# Patient Record
Sex: Female | Born: 1990 | Race: White | Hispanic: No | Marital: Married | Smoking: Never smoker
Health system: Southern US, Community
[De-identification: ages and names within clinical notes are randomized; demographics above are authoritative.]

## PROBLEM LIST (undated history)

## (undated) DIAGNOSIS — Z148 Genetic carrier of other disease: Secondary | ICD-10-CM

## (undated) DIAGNOSIS — Z789 Other specified health status: Secondary | ICD-10-CM

## (undated) HISTORY — PX: GALLBLADDER SURGERY: SHX652

## (undated) HISTORY — PX: TONSILLECTOMY: SUR1361

## (undated) HISTORY — PX: WISDOM TOOTH EXTRACTION: SHX21

## (undated) HISTORY — DX: Genetic carrier of other disease: Z14.8

---

## 2016-01-31 DIAGNOSIS — O0281 Inappropriate change in quantitative human chorionic gonadotropin (hCG) in early pregnancy: Secondary | ICD-10-CM

## 2016-06-01 ENCOUNTER — Ambulatory Visit (INDEPENDENT_AMBULATORY_CARE_PROVIDER_SITE_OTHER): Payer: BLUE CROSS/BLUE SHIELD | Admitting: Obstetrics and Gynecology

## 2016-06-01 ENCOUNTER — Encounter: Payer: Self-pay | Admitting: Obstetrics and Gynecology

## 2016-06-01 VITALS — BP 103/63 | HR 64 | Ht 68.0 in | Wt 151.2 lb

## 2016-06-01 DIAGNOSIS — N912 Amenorrhea, unspecified: Secondary | ICD-10-CM | POA: Diagnosis not present

## 2016-06-01 DIAGNOSIS — Z148 Genetic carrier of other disease: Secondary | ICD-10-CM

## 2016-06-01 LAB — POCT URINALYSIS DIPSTICK
Bilirubin, UA: NEGATIVE
Glucose, UA: NEGATIVE
Ketones, UA: NEGATIVE
Leukocytes, UA: NEGATIVE
NITRITE UA: NEGATIVE
PH UA: 5 (ref 5.0–8.0)
Protein, UA: NEGATIVE
RBC UA: NEGATIVE
SPEC GRAV UA: 1.01 (ref 1.010–1.025)
UROBILINOGEN UA: 0.2 U/dL

## 2016-06-01 NOTE — Progress Notes (Signed)
HPI:      [name] is a 26 y.o. [phone] who LMP was Patient's last menstrual period was 04/20/2016 (exact date).  Subjective:   She presents today With complaint of amenorrhea likely consistent with pregnancy. Based on her last menstrual period. She is 6 weeks estimated gestational age. 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 One of her female children has fragile X. Her previous 2 deliveries were vaginal. She has no complaints today.    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 She  does not have a problem list on file. She  has a past surgical history that includes Gallbladder surgery and Tonsillectomy. Her family history includes Asthma in her sister; Hypertension in her father and mother; Thyroid disease in her sister. She  reports that she has never smoked. She has never used smokeless tobacco. She reports that she does not drink alcohol or use drugs. No current outpatient prescriptions on file prior to visit.   No current facility-administered medications on file prior to visit.          Review of Systems:  Review of Systems  Constitutional: Denied constitutional symptoms, night sweats, recent illness, fatigue, fever, insomnia and weight loss.  Eyes: Denied eye symptoms, eye pain, photophobia, vision change and visual disturbance.  Ears/Nose/Throat/Neck: Denied ear, nose, throat or neck symptoms, hearing loss, nasal discharge, sinus congestion and sore throat.  Cardiovascular: Denied cardiovascular symptoms, arrhythmia, chest pain/pressure, edema, exercise intolerance, orthopnea and palpitations.  Respiratory: Denied pulmonary symptoms, asthma, pleuritic pain, productive sputum, cough, dyspnea and wheezing.  Gastrointestinal: Denied, gastro-esophageal reflux, melena, nausea and  vomiting.  Genitourinary: See HPI for additional information.  Musculoskeletal: Denied musculoskeletal symptoms, stiffness, swelling, muscle weakness and myalgia.  Dermatologic: Denied dermatology symptoms, rash and scar.  Neurologic: Denied neurology symptoms, dizziness, headache, neck pain and syncope.  Psychiatric: Denied psychiatric symptoms, anxiety and depression.  Endocrine: Denied endocrine symptoms including hot flashes and night sweats.   Meds:   No current outpatient prescriptions on file prior to visit.   No current facility-administered medications on file prior to visit.     Objective:     Vitals:   06/01/16 0814  BP: 103/63  Pulse: 64                Assessment:    G4P2012 There are no active problems to display for this patient.    1. Amenorrhea   2. 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   Plan:            1.  Patient to continue prenatal vitamins  2.  We have discussed fragile X syndrome in detail. I have informed her that we will likely refer her to [facility] for consultation.  3.  We have discussed the use of panorama for some genetic abnormalities.  4.  Ultrasound for dating ordered. Orders Orders Placed This Encounter  Procedures  . US OB Comp Less 14 Wks  . POCT urinalysis dipstick     Meds ordered this encounter  Medications  . Prenatal Vit-Fe Fumarate-FA (PRENATAL MULTIVITAMIN) TABS tablet    Sig: Take 1 tablet by mouth daily at 12 noon.  . folic acid (FOLVITE) 1 MG tablet  Sig: Take 1 mg by mouth daily.        F/U  Return in about 6 weeks (around 07/13/2016). I spent 31 minutes with this patient of which greater than 50% was spent discussing fragile X syndrome, follow-up, early pregnancy, panorama testing.  Elonda Husky, M.D. 06/01/2016 1:24 PM

## 2016-06-09 ENCOUNTER — Other Ambulatory Visit: Payer: Self-pay | Admitting: Obstetrics and Gynecology

## 2016-06-09 DIAGNOSIS — N912 Amenorrhea, unspecified: Secondary | ICD-10-CM

## 2016-06-09 DIAGNOSIS — O3680X0 Pregnancy with inconclusive fetal viability, not applicable or unspecified: Secondary | ICD-10-CM

## 2016-06-09 DIAGNOSIS — Z148 Genetic carrier of other disease: Secondary | ICD-10-CM

## 2016-06-15 ENCOUNTER — Ambulatory Visit (INDEPENDENT_AMBULATORY_CARE_PROVIDER_SITE_OTHER): Payer: BLUE CROSS/BLUE SHIELD

## 2016-06-15 ENCOUNTER — Ambulatory Visit: Payer: BLUE CROSS/BLUE SHIELD | Admitting: Certified Nurse Midwife

## 2016-06-15 ENCOUNTER — Other Ambulatory Visit: Payer: Self-pay | Admitting: Obstetrics and Gynecology

## 2016-06-15 VITALS — BP 104/67 | HR 59 | Ht 69.0 in | Wt 154.4 lb

## 2016-06-15 DIAGNOSIS — Z148 Genetic carrier of other disease: Secondary | ICD-10-CM

## 2016-06-15 DIAGNOSIS — O3680X0 Pregnancy with inconclusive fetal viability, not applicable or unspecified: Secondary | ICD-10-CM

## 2016-06-15 DIAGNOSIS — Z3481 Encounter for supervision of other normal pregnancy, first trimester: Secondary | ICD-10-CM

## 2016-06-15 DIAGNOSIS — N912 Amenorrhea, unspecified: Secondary | ICD-10-CM

## 2016-06-15 NOTE — Progress Notes (Signed)
Madeline Hoffman presents for NOB nurse interview visit. Pregnancy confirmation done _5/3/18_____.  G- .4  P- .2 Pregnancy education material explained and given. ___0 cats in the home. NOB labs ordered.  HIV labs and Drug screen were explained optional and she did not decline. Drug screen ordered. PNV encouraged. Genetic screening options discussed. Genetic testing:Unsure.  Pt may discuss with provider. Pt. To follow up with provider in _4_ weeks for NOB physical.  All questions answered.

## 2016-06-16 LAB — URINALYSIS, ROUTINE W REFLEX MICROSCOPIC
Bilirubin, UA: NEGATIVE
Glucose, UA: NEGATIVE
Ketones, UA: NEGATIVE
LEUKOCYTES UA: NEGATIVE
Nitrite, UA: NEGATIVE
PH UA: 6 (ref 5.0–7.5)
Protein, UA: NEGATIVE
RBC UA: NEGATIVE
Specific Gravity, UA: 1.028 (ref 1.005–1.030)
Urobilinogen, Ur: 0.2 mg/dL (ref 0.2–1.0)

## 2016-06-16 LAB — HIV ANTIBODY (ROUTINE TESTING W REFLEX): HIV Screen 4th Generation wRfx: NONREACTIVE

## 2016-06-16 LAB — MONITOR DRUG PROFILE 14(MW)
Amphetamine Scrn, Ur: NEGATIVE ng/mL
BARBITURATE SCREEN URINE: NEGATIVE ng/mL
BENZODIAZEPINE SCREEN, URINE: NEGATIVE ng/mL
BUPRENORPHINE, URINE: NEGATIVE ng/mL
CANNABINOIDS UR QL SCN: NEGATIVE ng/mL
Cocaine (Metab) Scrn, Ur: NEGATIVE ng/mL
Creatinine(Crt), U: 157.1 mg/dL (ref 20.0–300.0)
FENTANYL, URINE: NEGATIVE pg/mL
METHADONE SCREEN, URINE: NEGATIVE ng/mL
Meperidine Screen, Urine: NEGATIVE ng/mL
OXYCODONE+OXYMORPHONE UR QL SCN: NEGATIVE ng/mL
Opiate Scrn, Ur: NEGATIVE ng/mL
PH UR, DRUG SCRN: 5.9 (ref 4.5–8.9)
PHENCYCLIDINE QUANTITATIVE URINE: NEGATIVE ng/mL
Propoxyphene Scrn, Ur: NEGATIVE ng/mL
SPECIFIC GRAVITY: 1.024
Tramadol Screen, Urine: NEGATIVE ng/mL

## 2016-06-16 LAB — ABO AND RH: RH TYPE: POSITIVE

## 2016-06-16 LAB — CBC WITH DIFFERENTIAL
Basophils Absolute: 0 10*3/uL (ref 0.0–0.2)
Basos: 0 %
EOS (ABSOLUTE): 0.1 10*3/uL (ref 0.0–0.4)
Eos: 2 %
Hematocrit: 36.8 % (ref 34.0–46.6)
Hemoglobin: 12.4 g/dL (ref 11.1–15.9)
IMMATURE GRANS (ABS): 0 10*3/uL (ref 0.0–0.1)
Immature Granulocytes: 0 %
LYMPHS: 31 %
Lymphocytes Absolute: 2.2 10*3/uL (ref 0.7–3.1)
MCH: 29.8 pg (ref 26.6–33.0)
MCHC: 33.7 g/dL (ref 31.5–35.7)
MCV: 89 fL (ref 79–97)
MONOCYTES: 4 %
Monocytes Absolute: 0.3 10*3/uL (ref 0.1–0.9)
NEUTROS ABS: 4.3 10*3/uL (ref 1.4–7.0)
NEUTROS PCT: 63 %
RBC: 4.16 x10E6/uL (ref 3.77–5.28)
RDW: 14 % (ref 12.3–15.4)
WBC: 6.9 10*3/uL (ref 3.4–10.8)

## 2016-06-16 LAB — RPR: RPR Ser Ql: NONREACTIVE

## 2016-06-16 LAB — NICOTINE SCREEN, URINE: Cotinine Ql Scrn, Ur: NEGATIVE ng/mL

## 2016-06-16 LAB — VARICELLA ZOSTER ANTIBODY, IGG: Varicella zoster IgG: 1721 index (ref >165)

## 2016-06-16 LAB — ANTIBODY SCREEN: Antibody Screen: NEGATIVE

## 2016-06-16 LAB — RUBELLA SCREEN: Rubella Antibodies, IGG: 3.54 index (ref >0.99)

## 2016-06-16 LAB — HEPATITIS B SURFACE ANTIGEN: Hepatitis B Surface Ag: NEGATIVE

## 2016-06-17 LAB — URINE CULTURE

## 2016-07-03 ENCOUNTER — Ambulatory Visit (INDEPENDENT_AMBULATORY_CARE_PROVIDER_SITE_OTHER): Payer: BLUE CROSS/BLUE SHIELD | Admitting: Obstetrics and Gynecology

## 2016-07-03 ENCOUNTER — Encounter: Payer: Self-pay | Admitting: Obstetrics and Gynecology

## 2016-07-03 ENCOUNTER — Ambulatory Visit (INDEPENDENT_AMBULATORY_CARE_PROVIDER_SITE_OTHER): Payer: BLUE CROSS/BLUE SHIELD

## 2016-07-03 VITALS — BP 94/61 | HR 62 | Wt 157.5 lb

## 2016-07-03 DIAGNOSIS — Z3491 Encounter for supervision of normal pregnancy, unspecified, first trimester: Secondary | ICD-10-CM | POA: Diagnosis not present

## 2016-07-03 DIAGNOSIS — Z148 Genetic carrier of other disease: Secondary | ICD-10-CM | POA: Diagnosis not present

## 2016-07-03 DIAGNOSIS — O200 Threatened abortion: Secondary | ICD-10-CM

## 2016-07-03 DIAGNOSIS — Z8279 Family history of other congenital malformations, deformations and chromosomal abnormalities: Secondary | ICD-10-CM | POA: Diagnosis not present

## 2016-07-03 LAB — POCT URINALYSIS DIPSTICK
BILIRUBIN UA: NEGATIVE
Glucose, UA: NEGATIVE
Ketones, UA: NEGATIVE
LEUKOCYTES UA: NEGATIVE
NITRITE UA: NEGATIVE
PH UA: 5 (ref 5.0–8.0)
Protein, UA: NEGATIVE
Spec Grav, UA: 1.02 (ref 1.010–1.025)
UROBILINOGEN UA: 0.2 U/dL

## 2016-07-03 NOTE — Progress Notes (Signed)
HPI:      [name] is a 26 y.o. [phone] who LMP was Patient's last menstrual period was 04/20/2016 (exact date).  Subjective:   She presents today With complaint of increasing vaginal bleeding. She is approximately 10 weeks estimated gestational age based on crown-rump length. She has a family history of fragile X syndrome. She reports that both of her sisters went through premature menopause at age 35 and 7. She states that she had a previous miscarriage with a positive pregnancy test but no obvious miscarriage or obvious pregnancy.    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 She  does not have a problem list on file. She  has a past surgical history that includes Gallbladder surgery and Tonsillectomy. She has no allergies on file.       Review of Systems:  Review of Systems  Constitutional: Denied constitutional symptoms, night sweats, recent illness, fatigue, fever, insomnia and weight loss.  Eyes: Denied eye symptoms, eye pain, photophobia, vision change and visual disturbance.  Ears/Nose/Throat/Neck: Denied ear, nose, throat or neck symptoms, hearing loss, nasal discharge, sinus congestion and sore throat.  Cardiovascular: Denied cardiovascular symptoms, arrhythmia, chest pain/pressure, edema, exercise intolerance, orthopnea and palpitations.  Respiratory: Denied pulmonary symptoms, asthma, pleuritic pain, productive sputum, cough, dyspnea and wheezing.  Gastrointestinal: Denied, gastro-esophageal reflux, melena, nausea and vomiting.  Genitourinary: See HPI for additional information.  Musculoskeletal: Denied musculoskeletal symptoms, stiffness, swelling, muscle weakness and myalgia.  Dermatologic: Denied dermatology symptoms, rash and scar.  Neurologic: Denied neurology symptoms, dizziness, headache, neck pain and syncope.  Psychiatric:  Denied psychiatric symptoms, anxiety and depression.  Endocrine: Denied endocrine symptoms including hot flashes and night sweats.   Meds:   Current Outpatient Prescriptions on File Prior to Visit  Medication Sig Dispense Refill  . folic acid (FOLVITE) 1 MG tablet Take 1 mg by mouth daily.    . Prenatal Vit-Fe Fumarate-FA (PRENATAL MULTIVITAMIN) TABS tablet Take 1 tablet by mouth daily at 12 noon.     No current facility-administered medications on file prior to visit.     Objective:     Vitals:   07/03/16 1337  BP: 94/61  Pulse: 62              Physical examination   Pelvic:   Vulva: Normal appearance.  No lesions.  Vagina: No lesions or abnormalities noted.  Moderate vaginal bleeding   Support: Normal pelvic support.  Urethra No masses tenderness or scarring.  Meatus Normal size without lesions or prolapse.  Cervix: Normal appearance.  No lesions.  Cervix closed   Anus: Normal exam.  No lesions.  Perineum: Normal exam.  No lesions.        Bimanual   Uterus: Enlarged.  Non-tender.  Mobile.  AV.  Adnexae: No masses.  Non-tender to palpation.  Cul-de-sac: Negative for abnormality.     Assessment:    [id number] There are no active problems to display for this patient.    1. Threatened abortion in early pregnancy   2. Family history of fragile X syndrome        Plan:            1.  Patient sent to ultrasound. Nonviable pregnancy identified. I have discussed this with her in detail. At this time she would like to undergo expectant  management with the idea of completing the miscarriage. I have offered her D and E and she will consider this later in the week if necessary.  SAb I have discussed the possibility of miscarriage with the patient.  I have informed her that vaginal bleeding, cramping or passage of tissue are the most common signs of miscarriage.  Should she have heavy bleeding, pass tissue or have other problems, she has been informed to call the office  immediately.  I have advised her to remain at pelvic rest for at least one week after her last episode of bleeding.  If she is currently working, I have instructed her to discontinue until this situation resolves.  Complete versus incomplete miscarriage was discussed and the patient is aware that should she develop heavy bleeding, fever, or persistent crampy pelvic pain she may require a D & E to remove the remaining portion of the miscarried pregnancy.  I advised her to keep me informed should her condition change.      Orders Orders Placed This Encounter  Procedures  . US OB Comp Less 14 Wks  . POCT urinalysis dipstick    No orders of the defined types were placed in this encounter.       F/U  Return in about 2 days (around 07/05/2016).  Elonda Huskyavid J. Billyjack Trompeter, M.D. 07/03/2016 3:09 PM

## 2016-07-05 ENCOUNTER — Encounter: Payer: Self-pay | Admitting: Obstetrics and Gynecology

## 2016-07-05 ENCOUNTER — Encounter: Payer: BLUE CROSS/BLUE SHIELD | Admitting: Obstetrics and Gynecology

## 2016-07-05 ENCOUNTER — Ambulatory Visit (INDEPENDENT_AMBULATORY_CARE_PROVIDER_SITE_OTHER): Payer: BLUE CROSS/BLUE SHIELD | Admitting: Obstetrics and Gynecology

## 2016-07-05 VITALS — BP 112/72 | HR 80 | Ht 69.0 in | Wt 155.4 lb

## 2016-07-05 DIAGNOSIS — Z148 Genetic carrier of other disease: Secondary | ICD-10-CM

## 2016-07-05 DIAGNOSIS — O039 Complete or unspecified spontaneous abortion without complication: Secondary | ICD-10-CM

## 2016-07-05 NOTE — Progress Notes (Signed)
HPI:      [name] is a 26 y.o. [phone] who LMP was Patient's last menstrual period was 04/20/2016 (exact date).  Subjective:   She presents today After having significant bleeding and cramping yesterday. She also states that she passed a small sac. She reports that her cramps are completely resolved. She does have some continued bleeding but says it is not heavy. We have again discussed fragile X syndrome and she is interested in a referral to [facility].    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 She  does not have a problem list on file. She  has a past surgical history that includes Gallbladder surgery and Tonsillectomy. Her family history includes Asthma in her sister; Hypertension in her father and mother; Thyroid disease in her sister. She  reports that she has never smoked. She has never used smokeless tobacco. She reports that she does not drink alcohol or use drugs. She has no allergies on file.       Review of Systems:  Review of Systems  Constitutional: Denied constitutional symptoms, night sweats, recent illness, fatigue, fever, insomnia and weight loss.  Eyes: Denied eye symptoms, eye pain, photophobia, vision change and visual disturbance.  Ears/Nose/Throat/Neck: Denied ear, nose, throat or neck symptoms, hearing loss, nasal discharge, sinus congestion and sore throat.  Cardiovascular: Denied cardiovascular symptoms, arrhythmia, chest pain/pressure, edema, exercise intolerance, orthopnea and palpitations.  Respiratory: Denied pulmonary symptoms, asthma, pleuritic pain, productive sputum, cough, dyspnea and wheezing.  Gastrointestinal: Denied, gastro-esophageal reflux, melena, nausea and vomiting.  Genitourinary: Denied genitourinary symptoms including symptomatic vaginal discharge, pelvic relaxation issues, and urinary complaints.   Musculoskeletal: Denied musculoskeletal symptoms, stiffness, swelling, muscle weakness and myalgia.  Dermatologic: Denied dermatology symptoms, rash and scar.  Neurologic: Denied neurology symptoms, dizziness, headache, neck pain and syncope.  Psychiatric: Denied psychiatric symptoms, anxiety and depression.  Endocrine: Denied endocrine symptoms including hot flashes and night sweats.   Meds:   Current Outpatient Prescriptions on File Prior to Visit  Medication Sig Dispense Refill  . folic acid (FOLVITE) 1 MG tablet Take 1 mg by mouth daily.    . Prenatal Vit-Fe Fumarate-FA (PRENATAL MULTIVITAMIN) TABS tablet Take 1 tablet by mouth daily at 12 noon.     No current facility-administered medications on file prior to visit.     Objective:     Vitals:   07/05/16 0927  BP: 112/72  Pulse: 80                Assessment:    G4P2012 There are no active problems to display for this patient.    1. Complete miscarriage   2. 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   Plan:            1.  We have discussed future fertility, timing of intercourse, timing of future pregnancy, continued use of prenatal vitamins, possibility of incomplete miscarriage.  2.  Refer to [facility] Orders Orders Placed This Encounter  Procedures  . Ambulatory referral to Infertility    No orders of the defined types were placed in this encounter.       F/U  Return in about 6 weeks (around 08/16/2016). I spent  17 minutes with this patient of which greater than 50% was spent discussing We have discussed future fertility, timing of intercourse, timing of future pregnancy, continued use of prenatal vitamins, possibility of incomplete miscarriage fragile X syndrome.    Madeline Hoffman, M.D. 07/05/2016 10:16 AM

## 2016-07-13 ENCOUNTER — Encounter: Payer: BLUE CROSS/BLUE SHIELD | Admitting: Obstetrics and Gynecology

## 2016-08-04 ENCOUNTER — Telehealth: Payer: Self-pay | Admitting: Obstetrics and Gynecology

## 2016-08-04 NOTE — Telephone Encounter (Signed)
Patient LVM that she has a SAB a month ago and she wasn't sure if she had any lab work. She has had 2 positive pregnancy test and the line is darkening - could this be from the miscarriage?? Please call

## 2016-08-07 ENCOUNTER — Ambulatory Visit (INDEPENDENT_AMBULATORY_CARE_PROVIDER_SITE_OTHER): Payer: BLUE CROSS/BLUE SHIELD | Admitting: Obstetrics and Gynecology

## 2016-08-07 DIAGNOSIS — M545 Low back pain: Secondary | ICD-10-CM

## 2016-08-07 LAB — POCT URINALYSIS DIPSTICK
Bilirubin, UA: NEGATIVE
GLUCOSE UA: NEGATIVE
Ketones, UA: NEGATIVE
Leukocytes, UA: NEGATIVE
Nitrite, UA: NEGATIVE
PROTEIN UA: NEGATIVE
RBC UA: NEGATIVE
SPEC GRAV UA: 1.01 (ref 1.010–1.025)
UROBILINOGEN UA: 0.2 U/dL
pH, UA: 6 (ref 5.0–8.0)

## 2016-08-07 LAB — POCT URINE PREGNANCY: PREG TEST UR: POSITIVE — AB

## 2016-08-07 NOTE — Telephone Encounter (Signed)
Patient called again this am - she started having really bad back pain and bleeding yesterday around noon

## 2016-08-08 ENCOUNTER — Ambulatory Visit (INDEPENDENT_AMBULATORY_CARE_PROVIDER_SITE_OTHER): Payer: BLUE CROSS/BLUE SHIELD | Admitting: Obstetrics and Gynecology

## 2016-08-08 ENCOUNTER — Encounter: Payer: Self-pay | Admitting: Obstetrics and Gynecology

## 2016-08-08 ENCOUNTER — Telehealth: Payer: Self-pay | Admitting: Obstetrics and Gynecology

## 2016-08-08 VITALS — BP 104/67 | HR 61 | Ht 68.0 in | Wt 150.4 lb

## 2016-08-08 DIAGNOSIS — O200 Threatened abortion: Secondary | ICD-10-CM

## 2016-08-08 NOTE — Telephone Encounter (Signed)
Appointment made for today

## 2016-08-08 NOTE — Progress Notes (Signed)
HPI:      [name] is a 26 y.o. [phone] who LMP was Patient's last menstrual period was 04/20/2016 (exact date).  Subjective:   She presents today Complaining of lower back pain and vaginal spotting for 2 days. She had a positive pregnancy test yesterday.  She had a miscarriage 5 weeks ago and she is certain that "at the time of miscarriage she saw a fetus and sac." As had unprotected intercourse multiple times since her miscarriage. She did make an appointment to see perinatology regarding fragile X syndrome-that appointment is in September.    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 She  does not have a problem list on file. She  has a past surgical history that includes Gallbladder surgery and Tonsillectomy. Her family history includes Asthma in her sister; Hypertension in her father and mother; Thyroid disease in her sister. She  reports that she has never smoked. She has never used smokeless tobacco. She reports that she does not drink alcohol or use drugs. She has no allergies on file.       Review of Systems:  Review of Systems  Constitutional: Denied constitutional symptoms, night sweats, recent illness, fatigue, fever, insomnia and weight loss.  Eyes: Denied eye symptoms, eye pain, photophobia, vision change and visual disturbance.  Ears/Nose/Throat/Neck: Denied ear, nose, throat or neck symptoms, hearing loss, nasal discharge, sinus congestion and sore throat.  Cardiovascular: Denied cardiovascular symptoms, arrhythmia, chest pain/pressure, edema, exercise intolerance, orthopnea and palpitations.  Respiratory: Denied pulmonary symptoms, asthma, pleuritic pain, productive sputum, cough, dyspnea and wheezing.  Gastrointestinal: Denied, gastro-esophageal reflux, melena, nausea and vomiting.  Genitourinary: See HPI for additional  information.  Musculoskeletal: Denied musculoskeletal symptoms, stiffness, swelling, muscle weakness and myalgia.  Dermatologic: Denied dermatology symptoms, rash and scar.  Neurologic: Denied neurology symptoms, dizziness, headache, neck pain and syncope.  Psychiatric: Denied psychiatric symptoms, anxiety and depression.  Endocrine: Denied endocrine symptoms including hot flashes and night sweats.   Meds:   Current Outpatient Prescriptions on File Prior to Visit  Medication Sig Dispense Refill  . Prenatal Vit-Fe Fumarate-FA (PRENATAL MULTIVITAMIN) TABS tablet Take 1 tablet by mouth daily at 12 noon.    . folic acid (FOLVITE) 1 MG tablet Take 1 mg by mouth daily.     No current facility-administered medications on file prior to visit.     Objective:     Vitals:   08/08/16 1339  BP: 104/67  Pulse: 61                Assessment:    G4P2012 There are no active problems to display for this patient.    1. Threatened abortion in first trimester     Possibility of small amount of retained products continuing to make hCG  Possibility of previously completed miscarriage and new pregnancy  Possibility of continued pregnancy and no completed miscarriage (most unlikely)   Plan:            1.  Quantitative beta hCG today and in 2 days.  2.  Ultrasound follow-up as needed. Orders Orders Placed This Encounter  Procedures  . Beta HCG, Quant  . hCG, quantitative, pregnancy    No orders of the defined types were placed in this encounter.       F/U  Return for We will  call her to set up next visit, We will contact her with any abnormal test results. I spent 17 minutes with this patient of which greater than 50% was spent discussing positive pregnancy test and its ramifications. The risks of miscarriage versus continued pregnancy.  Elonda Husky, M.D. 08/08/2016 2:14 PM

## 2016-08-09 LAB — BETA HCG QUANT (REF LAB): hCG Quant: 288 m[IU]/mL

## 2016-08-10 ENCOUNTER — Telehealth: Payer: Self-pay

## 2016-08-10 ENCOUNTER — Telehealth: Payer: Self-pay | Admitting: Obstetrics and Gynecology

## 2016-08-10 ENCOUNTER — Other Ambulatory Visit: Payer: Self-pay | Admitting: Obstetrics and Gynecology

## 2016-08-10 ENCOUNTER — Other Ambulatory Visit: Payer: BLUE CROSS/BLUE SHIELD

## 2016-08-10 DIAGNOSIS — O200 Threatened abortion: Secondary | ICD-10-CM

## 2016-08-10 LAB — BETA HCG QUANT (REF LAB): HCG QUANT: 568 m[IU]/mL

## 2016-08-10 NOTE — Telephone Encounter (Signed)
Please call patient with lab results

## 2016-08-10 NOTE — Telephone Encounter (Signed)
Hi Anjolaoluwa- I called the number listed but there was no answer or voicemail. The # was [phone] and Dr. Logan BoresEvans said this is probably a new pregnancy. He said it could be a pregnancy in the uterus or it could be an ectopic. Please keep your scheduled appointments but let us know if you have further questions or concerns. Thanks Jasmine DecemberSharon

## 2016-08-10 NOTE — Telephone Encounter (Signed)
Per Janelle at [facility]- STAT HCG- resulting at 568. Per Dr Logan BoresEvans- probable new pregnancy.

## 2016-08-11 ENCOUNTER — Telehealth: Payer: Self-pay | Admitting: Obstetrics and Gynecology

## 2016-08-11 NOTE — Telephone Encounter (Signed)
Spoke with patient- Quant # given. Per providers last note office will call pt to set up further appointments. Pt agreeable. Pt also states she has had a small amount of brown discharge and some mild cramping. Instructed to seek emergent care if pain or bleeding get worse. Pt expressed understanding.

## 2016-08-11 NOTE — Telephone Encounter (Signed)
Patient Madeline Hoffman and was upset that both home and cell numbers were not called. Patient stated that she would like to speak with a Nurse to go over her results, The patient did not disclose any other information. Please advise.

## 2016-08-15 ENCOUNTER — Telehealth: Payer: Self-pay

## 2016-08-15 NOTE — Telephone Encounter (Signed)
-----   Message from Linzie Collinavid James Evans, MD sent at 08/15/2016  2:53 PM EDT ----- She needs another [place] tomorrow

## 2016-08-15 NOTE — Telephone Encounter (Signed)
Mychart message sent.

## 2016-08-16 ENCOUNTER — Encounter: Payer: BLUE CROSS/BLUE SHIELD | Admitting: Obstetrics and Gynecology

## 2016-08-23 ENCOUNTER — Telehealth: Payer: Self-pay

## 2016-08-24 ENCOUNTER — Telehealth: Payer: Self-pay

## 2016-08-24 NOTE — Telephone Encounter (Signed)
Left message on pts voicemail - following up on whether she got a quant done and where.

## 2016-08-25 NOTE — Telephone Encounter (Signed)
Pt is receiving [facility] at [facility]- per pt.

## 2016-09-18 ENCOUNTER — Other Ambulatory Visit: Payer: Self-pay | Admitting: Obstetrics and Gynecology

## 2016-09-18 DIAGNOSIS — Z369 Encounter for antenatal screening, unspecified: Secondary | ICD-10-CM

## 2016-10-05 ENCOUNTER — Ambulatory Visit
Admission: RE | Admit: 2016-10-05 | Discharge: 2016-10-05 | Disposition: A | Discharge status: Home / Self Care | Payer: BLUE CROSS/BLUE SHIELD | Source: Ambulatory Visit | Attending: Maternal & Fetal Medicine | Admitting: Maternal & Fetal Medicine

## 2016-10-05 ENCOUNTER — Ambulatory Visit (HOSPITAL_BASED_OUTPATIENT_CLINIC_OR_DEPARTMENT_OTHER)
Admission: RE | Admit: 2016-10-05 | Discharge: 2016-10-05 | Disposition: A | Discharge status: Home / Self Care | Payer: BLUE CROSS/BLUE SHIELD | Source: Ambulatory Visit | Attending: Maternal & Fetal Medicine | Admitting: Maternal & Fetal Medicine

## 2016-10-05 VITALS — BP 113/73 | HR 85 | Temp 97.8°F | Resp 18 | Wt 163.2 lb

## 2016-10-05 DIAGNOSIS — Z3401 Encounter for supervision of normal first pregnancy, first trimester: Secondary | ICD-10-CM | POA: Insufficient documentation

## 2016-10-05 DIAGNOSIS — Z148 Genetic carrier of other disease: Secondary | ICD-10-CM

## 2016-10-05 DIAGNOSIS — Z369 Encounter for antenatal screening, unspecified: Secondary | ICD-10-CM

## 2016-10-05 DIAGNOSIS — Z3A12 12 weeks gestation of pregnancy: Secondary | ICD-10-CM | POA: Diagnosis not present

## 2016-10-05 NOTE — Progress Notes (Signed)
[facility] Length of Consultation: 60 minutes   Ms. Kunzler  [facility] to review prenatal screening and testing options.  We also discussed her family history of Fragile X syndrome in detail.  This note summarizes the information we discussed.    We offered the following routine screening tests for this pregnancy:  First trimester screening, which includes nuchal translucency ultrasound screen and first trimester maternal serum marker screening.  The nuchal translucency has approximately an 80% detection rate for Down syndrome and can be positive for other chromosome abnormalities as well as congenital heart defects.  When combined with a maternal serum marker screening, the detection rate is up to 90% for Down syndrome and up to 97% for trisomy 18.     Maternal serum marker screening, a blood test that measures pregnancy proteins, can provide risk assessments for Down syndrome, trisomy 18, and open neural tube defects (spina bifida, anencephaly). Because it does not directly examine the fetus, it cannot positively diagnose or rule out these problems.  Targeted ultrasound uses high frequency sound waves to create an image of the developing fetus.  An ultrasound is often recommended as a routine means of evaluating the pregnancy.  It is also used to screen for fetal anatomy problems (for example, a heart defect) that might be suggestive of a chromosomal or other abnormality.   Should these screening tests indicate an increased concern, then the following additional testing options would be offered:  The chorionic villus sampling procedure is available for first trimester chromosome analysis.  This involves the withdrawal of a small amount of chorionic villi (tissue from the developing placenta).  Risk of pregnancy loss is estimated to be approximately 1 in 200 to 1 in 100 (0.5 to 1%).  There is approximately a 1% (1  in 100) chance that the CVS chromosome results will be unclear.  Chorionic villi cannot be tested for neural tube defects.     Amniocentesis involves the removal of a small amount of amniotic fluid from the sac surrounding the fetus with the use of a thin needle inserted through the maternal abdomen and uterus.  Ultrasound guidance is used throughout the procedure.  Fetal cells from amniotic fluid are directly evaluated and > 99.5% of chromosome problems and > 98% of open neural tube defects can be detected. This procedure is generally performed after the 15th week of pregnancy.  The main risks to this procedure include complications leading to miscarriage in less than 1 in 200 cases (0.5%).  As another option for information if the pregnancy is suspected to be an an increased chance for certain chromosome conditions, we also reviewed the availability of cell free fetal DNA testing from maternal blood to determine whether or not the baby may have either Down syndrome, trisomy 8113, or trisomy 5818.  This test utilizes a maternal blood sample and DNA sequencing technology to isolate circulating cell free fetal DNA from maternal plasma.  The fetal DNA can then be analyzed for DNA sequences that are derived from the three most common chromosomes involved in aneuploidy, chromosomes 13, 18, and 21.  If the overall amount of DNA is greater than the expected level for any of these chromosomes, aneuploidy is suspected.  While we do not consider it a replacement for invasive testing and karyotype analysis, a negative result from this testing would be reassuring, though not a guarantee of a normal chromosome complement for the baby.  An  abnormal result is certainly suggestive of an abnormal chromosome complement, though we would still recommend CVS or amniocentesis to confirm any findings from this testing.  Cystic Fibrosis and Spinal Muscular Atrophy (SMA) screening were also discussed with the patient. Both conditions are  recessive, which means that both parents must be carriers in order to have a child with the disease.  Cystic fibrosis (CF) is one of the most common genetic conditions in persons of Caucasian ancestry.  This condition occurs in approximately 1 in 2,500 Caucasian persons and results in thickened secretions in the lungs, digestive, and reproductive systems.  For a baby to be at risk for having CF, both of the parents must be carriers for this condition.  Approximately 1 in 29 Caucasian persons is a carrier for CF.  Current carrier testing looks for the most common mutations in the gene for CF and can detect approximately 90% of carriers in the Caucasian population.  This means that the carrier screening can greatly reduce, but cannot eliminate, the chance for an individual to have a child with CF.  If an individual is found to be a carrier for CF, then carrier testing would be available for the partner. As part of [place]'s newborn screening profile, all babies born in the state of [place] will have a two-tier screening process.  Specimens are first tested to determine the concentration of immunoreactive trypsinogen (IRT).  The top 5% of specimens with the highest IRT values then undergo DNA testing using a panel of over 40 common CF mutations. SMA is a neurodegenerative disorder that leads to atrophy of skeletal muscle and overall weakness.  This condition is also more prevalent in the Caucasian population, with 1 in 40-1 in 60 persons being a carrier and 1 in 6,000-1 in 10,000 children being affected.  There are multiple forms of the disease, with some causing death in infancy to other forms with survival into adulthood.  The genetics of SMA is complex, but carrier screening can detect up to 95% of carriers in the Caucasian population.  Similar to CF, a negative result can greatly reduce, but cannot eliminate, the chance to have a child with SMA.  Ms. Chauncey planned to see if this testing was done in  her last pregnancy before deciding about carrier screening.    We obtained a detailed family history and pregnancy history.  Ms. Fetters stated that she has an extensive family history of Fragile X syndrome for which she has had genetic counseling numerous times throughout the years. The family was initially brought to medical attention when her sister had premature ovarian insufficiency at age 75 years.  Since that time, numerous family members have been tested.  Ms. Belford recalls that her repeat size is around 50.  She, all her sisters, their father, a paternal uncle and cousin all have been identified as having premutations per her report.  She and her husband have two sons, Chrissie Noa is 2 and is said to have a normal copy number; Billey Gosling is 1 and has a copy number of 190.  She is aware that this pregnancy is at 50% risk for inheriting her expanded allele which could remain as a premutation or expand to a full mutation (>200 copies), but she and her husband have decided that they do not want testing during this pregnancy for Fragile X and they do not desire to know gender of the baby.  The remainder of the family history was reported to be unremarkable for birth  defects, intellectual delays, recurrent pregnancy loss or known chromosome abnormalities.  To review, Fragile X syndrome is the most common inherited cause for intellectual disabilities in males.  Fragile X syndrome is caused by a change, or expansion, of a repeating segment of the DNA bases CGG in the FMR1 gene.  This gene is located on the X chromosome.  Females have two X chromosomes and males have one X and one Y chromosome.  Because males have only one copy of this chromosome, changes typically result in more severe features in males.  Carrier screening is used to determine the number of repeats in this region of the gene and to determine the likelihood of having a child with this condition.  This chance is based, in part, on the number of CGG  repeats identified.  This repeat tract is susceptible to expansion as it is passed on from generation to generation in a family.  Once this expansion reaches a specific size, an individual may be at risk to have a child with Fragile X syndrome.  The repeat sizes are classified into ranges, and risk is determined based on the size of the expansion.  These repeat sizes are classified as normal; with up to 44 repeats, gray zone; 45-54 repeats, premutation; 55-200 repeats, and full mutation; over 200 repeats.  Individuals with full mutations are expected to be affected with Fragile X syndrome.  Males with Fragile X syndrome typically have intellectual disabilities, autistic features and characteristic facial features.  Females may also show features of Fragile X syndrome, though the features are often more mild than those seen in males. Due to her carrier status, we discussed the option of testing the baby during pregnancy for the repeat size.  This could be performed through an amniocentesis.  Amniocentesis involves the removal of a small amount of amniotic fluid from the sac surrounding the fetus with the use of a thin needle inserted through the maternal abdomen and uterus.  Ultrasound guidance is used throughout the procedure.  Fetal cells from amniotic fluid are directly evaluated for the number of CGG repeats.  In addition, > 99.5% of chromosome problems and > 98% of open neural tube defects can be detected. This procedure is generally performed after the 15th week of pregnancy.  The main risks to this procedure include complications leading to miscarriage in less than 1 in 200 cases (0.5%).  Lastly, we discussed possible health consequences for premutation Fragile X carriers.  Women who carry a premutation are at an increased chance for premature ovarian insufficiency.  In approximately 20% of female carriers, there is reduced fertility with periods ending before the age of 44.  Carriers are also at risk for an  adult onset (>64 years old) neurodegenerative disorder which includes tremor and ataxia (abnormal gait) known at FXTAS (Fragile X Tremor and Ataxia Syndrome).  It is important that Ms. Woodlief make her primary care doctors aware of this history.  Ms. Dack reported no complications during this pregnancy except for early spotting.  She reported no exposures to medications, alcohol, tobacco or recreational drugs.  After consideration of the options, Ms. Villalva elected to proceed with first trimester screening.  She declined CF and SMA testing at this time and does not desire any genetic testing for Fragile X syndrome in this pregnancy.  She will be in touch with her pediatrician after delivery regarding a referral to Pediatric Medical Genetics for evaluation and testing of the baby.  We are happy to help facilitate this  referral if desired.  An ultrasound was performed at the time of the visit.  The gestational age was consistent with 12 weeks.  Fetal anatomy could not be assessed due to early gestational age.  Please refer to the ultrasound report for details of that study.  Ms. Golembeski was encouraged to call with questions or concerns.  We can be contacted at [phone].    Cherly Anderson, MS, CGC

## 2016-10-12 ENCOUNTER — Telehealth: Payer: Self-pay | Admitting: Obstetrics and Gynecology

## 2016-10-12 NOTE — Telephone Encounter (Signed)
   Madeline Hoffman elected to undergoRosal[facility] First Trimester screening as a part of her routine prenatal care on 10/05/2016.  To review, first trimester screening, includes nuchal translucency ultrasound screen and/or first trimester maternal serum marker screening.  The nuchal translucency has approximately an 80% detection rate for Down syndrome and can be positive for other chromosome abnormalities as well as heart defects.  When combined with a maternal serum marker screening, the detection rate is up to 90% for Down syndrome and up to 97% for trisomy 13 and 18.     The results of the First Trimester Nuchal Translucency and Biochemical Screening were within normal range for aneuploidy.  The risk for Down syndrome is now estimated to be 1 in >10,000.  The risk for Trisomy 13/18 is also estimated to be 1 in >10,000.  Should more definitive information be desired, we would offer amniocentesis.  Because we do not yet know the effectiveness of combined first and second trimester screening, we do not recommend a maternal serum screen to assess the chance for chromosome conditions.  However, if screening for neural tube defects is desired, maternal serum screening for AFP only can be performed between 15 and [redacted] weeks gestation.    Of note, the PAPP-A level was at the 5th percentile.  PAPP-A results at or below the 5th percentile have been associated with and increased chance for adverse obstetrical outcomes, including preeclampsia.  For this reason, a third trimester ultrasound for growth is recommended.  Please contact our office at [phone] if we can schedule this.   Madeline Andersoneborah F. Madeline Truax, MS, CGC

## 2016-10-12 NOTE — Progress Notes (Signed)
Pt seen by me, agree with assessment and plan outlined in CGC Wells's note 

## 2017-01-30 NOTE — L&D Delivery Note (Signed)
Date of delivery: 04/13/17 Estimated Date of Delivery: 04/15/17 Patient's last menstrual period was 07/09/2016. EGA: [id number]  Delivery Note At 6:05 PM a viable female was delivered via Vaginal, Spontaneous (Presentation: direct occiput posterior; cephalic).  APGAR: 8, 9; weight 8 lb 5.7 oz (3790 g).   Placenta status: spontaneous, intact.  Cord: 3vv, with cord blood donation collected, with the following complications: none.  Cord pH: not collected  Anesthesia:  epidural Episiotomy: None Lacerations: 1st degree - hemostatic Suture Repair: none Est. Blood Loss (mL): 265cc  Mom presented to L&D with IOL for low PAPP-A,  Pitocin and AROM.  epidual placed. Progressed to complete, second stage: 5mins, with delivery of fetal head with restitution to LOT.   Anterior then posterior shoulders delivered without difficulty.  Baby placed on mom's chest, and attended to by peds.  Cord was then clamped and cut immediately, with collection of cord blood for donation.  Placenta spontaneously delivered, intact.   IV pitocin given for hemorrhage prophylaxis.  We sang happy birthday to baby Madeline OlpKatherine Hoffman.   Mom to postpartum.  Baby to Couplet care / Skin to Skin.  [name] 04/13/2017, 6:42 PM

## 2017-04-04 NOTE — Progress Notes (Unsigned)
Orders placed.

## 2017-04-13 ENCOUNTER — Inpatient Hospital Stay
Admission: EM | Admit: 2017-04-13 | Discharge: 2017-04-14 | DRG: 807 | Disposition: A | Discharge status: Home / Self Care | Payer: BLUE CROSS/BLUE SHIELD | Attending: Obstetrics & Gynecology | Admitting: Obstetrics & Gynecology

## 2017-04-13 ENCOUNTER — Inpatient Hospital Stay: Payer: BLUE CROSS/BLUE SHIELD | Admitting: Anesthesiology

## 2017-04-13 ENCOUNTER — Other Ambulatory Visit: Payer: Self-pay | Admitting: Obstetrics and Gynecology

## 2017-04-13 ENCOUNTER — Other Ambulatory Visit: Payer: Self-pay

## 2017-04-13 ENCOUNTER — Encounter: Payer: Self-pay | Admitting: *Deleted

## 2017-04-13 DIAGNOSIS — Z148 Genetic carrier of other disease: Secondary | ICD-10-CM

## 2017-04-13 DIAGNOSIS — Z3483 Encounter for supervision of other normal pregnancy, third trimester: Secondary | ICD-10-CM | POA: Diagnosis present

## 2017-04-13 DIAGNOSIS — Z3A39 39 weeks gestation of pregnancy: Secondary | ICD-10-CM | POA: Diagnosis not present

## 2017-04-13 DIAGNOSIS — Z3490 Encounter for supervision of normal pregnancy, unspecified, unspecified trimester: Secondary | ICD-10-CM | POA: Diagnosis present

## 2017-04-13 HISTORY — DX: Other specified health status: Z78.9

## 2017-04-13 LAB — CBC
HCT: 33.8 % — ABNORMAL LOW (ref 35.0–47.0)
Hemoglobin: 11.7 g/dL — ABNORMAL LOW (ref 12.0–16.0)
MCH: 31.7 pg (ref 26.0–34.0)
MCHC: 34.7 g/dL (ref 32.0–36.0)
MCV: 91.3 fL (ref 80.0–100.0)
PLATELETS: 193 10*3/uL (ref 150–440)
RBC: 3.7 MIL/uL — AB (ref 3.80–5.20)
RDW: 13.2 % (ref 11.5–14.5)
WBC: 7 10*3/uL (ref 3.6–11.0)

## 2017-04-13 LAB — TYPE AND SCREEN
ABO/RH(D): A POS
ANTIBODY SCREEN: NEGATIVE

## 2017-04-13 MED ORDER — BUPIVACAINE HCL (PF) 0.25 % IJ SOLN
INTRAMUSCULAR | Status: DC | PRN
  Administered 2017-04-13 (×2): 4 mL via EPIDURAL

## 2017-04-13 MED ORDER — FENTANYL 2.5 MCG/ML W/ROPIVACAINE 0.15% IN NS 100 ML EPIDURAL (ARMC)
12.0000 mL/h | EPIDURAL | Status: DC
  Administered 2017-04-13: 12 mL/h via EPIDURAL

## 2017-04-13 MED ORDER — DIPHENHYDRAMINE HCL 50 MG/ML IJ SOLN: 12.5000 mg | INTRAMUSCULAR | Status: DC | PRN

## 2017-04-13 MED ORDER — AMMONIA AROMATIC IN INHA
RESPIRATORY_TRACT | Status: AC
  Filled 2017-04-13: qty 10

## 2017-04-13 MED ORDER — BENZOCAINE-MENTHOL 20-0.5 % EX AERO
1.0000 "application " | INHALATION_SPRAY | CUTANEOUS | Status: DC | PRN
  Administered 2017-04-13: 1 via TOPICAL
  Filled 2017-04-13: qty 56

## 2017-04-13 MED ORDER — LACTATED RINGERS IV SOLN
INTRAVENOUS | Status: DC
  Administered 2017-04-13: 09:00:00 via INTRAVENOUS

## 2017-04-13 MED ORDER — MISOPROSTOL 25 MCG QUARTER TABLET: 25.0000 ug | ORAL_TABLET | ORAL | Status: DC | PRN

## 2017-04-13 MED ORDER — MISOPROSTOL 200 MCG PO TABS
ORAL_TABLET | ORAL | Status: AC
  Filled 2017-04-13: qty 4

## 2017-04-13 MED ORDER — ONDANSETRON HCL 4 MG/2ML IJ SOLN: 4.0000 mg | INTRAMUSCULAR | Status: DC | PRN

## 2017-04-13 MED ORDER — FENTANYL 2.5 MCG/ML W/ROPIVACAINE 0.15% IN NS 100 ML EPIDURAL (ARMC)
EPIDURAL | Status: AC
  Filled 2017-04-13: qty 100

## 2017-04-13 MED ORDER — LIDOCAINE HCL (PF) 1 % IJ SOLN
INTRAMUSCULAR | Status: AC
  Filled 2017-04-13: qty 30

## 2017-04-13 MED ORDER — PRENATAL MULTIVITAMIN CH
1.0000 | ORAL_TABLET | Freq: Every day | ORAL | Status: DC
  Administered 2017-04-14: 1 via ORAL
  Filled 2017-04-13: qty 1

## 2017-04-13 MED ORDER — OXYTOCIN BOLUS FROM INFUSION
500.0000 mL | Freq: Once | INTRAVENOUS | Status: AC
  Administered 2017-04-13: 500 mL via INTRAVENOUS

## 2017-04-13 MED ORDER — EPHEDRINE 5 MG/ML INJ
10.0000 mg | INTRAVENOUS | Status: DC | PRN
  Filled 2017-04-13: qty 2

## 2017-04-13 MED ORDER — OXYTOCIN 10 UNIT/ML IJ SOLN
INTRAMUSCULAR | Status: AC
  Filled 2017-04-13: qty 2

## 2017-04-13 MED ORDER — LACTATED RINGERS IV SOLN: 500.0000 mL | INTRAVENOUS | Status: DC | PRN

## 2017-04-13 MED ORDER — BUTORPHANOL TARTRATE 1 MG/ML IJ SOLN: 1.0000 mg | INTRAMUSCULAR | Status: DC | PRN

## 2017-04-13 MED ORDER — TERBUTALINE SULFATE 1 MG/ML IJ SOLN: 0.2500 mg | Freq: Once | INTRAMUSCULAR | Status: DC | PRN

## 2017-04-13 MED ORDER — ACETAMINOPHEN 500 MG PO TABS
1000.0000 mg | ORAL_TABLET | Freq: Four times a day (QID) | ORAL | Status: DC | PRN
  Administered 2017-04-13 – 2017-04-14 (×2): 1000 mg via ORAL
  Filled 2017-04-13 (×2): qty 2

## 2017-04-13 MED ORDER — SIMETHICONE 80 MG PO CHEW: 80.0000 mg | CHEWABLE_TABLET | ORAL | Status: DC | PRN

## 2017-04-13 MED ORDER — DOCUSATE SODIUM 100 MG PO CAPS
100.0000 mg | ORAL_CAPSULE | Freq: Two times a day (BID) | ORAL | Status: DC
  Administered 2017-04-13 – 2017-04-14 (×2): 100 mg via ORAL
  Filled 2017-04-13 (×2): qty 1

## 2017-04-13 MED ORDER — ONDANSETRON HCL 4 MG/2ML IJ SOLN: 4.0000 mg | Freq: Four times a day (QID) | INTRAMUSCULAR | Status: DC | PRN

## 2017-04-13 MED ORDER — DIBUCAINE 1 % RE OINT
1.0000 | TOPICAL_OINTMENT | RECTAL | Status: DC | PRN
Start: 2017-04-13 — End: 2017-04-15

## 2017-04-13 MED ORDER — LIDOCAINE HCL (PF) 1 % IJ SOLN: 30.0000 mL | INTRAMUSCULAR | Status: DC | PRN

## 2017-04-13 MED ORDER — SODIUM CHLORIDE 0.9 % IV SOLN: 2.0000 g | Freq: Once | INTRAVENOUS | Status: DC

## 2017-04-13 MED ORDER — IBUPROFEN 600 MG PO TABS
600.0000 mg | ORAL_TABLET | Freq: Four times a day (QID) | ORAL | Status: DC
  Administered 2017-04-13 – 2017-04-14 (×4): 600 mg via ORAL
  Filled 2017-04-13 (×4): qty 1

## 2017-04-13 MED ORDER — PHENYLEPHRINE 40 MCG/ML (10ML) SYRINGE FOR IV PUSH (FOR BLOOD PRESSURE SUPPORT)
80.0000 ug | PREFILLED_SYRINGE | INTRAVENOUS | Status: DC | PRN
  Filled 2017-04-13: qty 5

## 2017-04-13 MED ORDER — LIDOCAINE HCL (PF) 1 % IJ SOLN
INTRAMUSCULAR | Status: DC | PRN
  Administered 2017-04-13: 3 mL

## 2017-04-13 MED ORDER — DIPHENHYDRAMINE HCL 25 MG PO CAPS: 25.0000 mg | ORAL_CAPSULE | Freq: Four times a day (QID) | ORAL | Status: DC | PRN

## 2017-04-13 MED ORDER — OXYTOCIN 40 UNITS IN LACTATED RINGERS INFUSION - SIMPLE MED
1.0000 m[IU]/min | INTRAVENOUS | Status: DC
  Administered 2017-04-13: 1 m[IU]/min via INTRAVENOUS
  Filled 2017-04-13: qty 1000

## 2017-04-13 MED ORDER — ACETAMINOPHEN 325 MG PO TABS: 650.0000 mg | ORAL_TABLET | ORAL | Status: DC | PRN

## 2017-04-13 MED ORDER — COCONUT OIL OIL
1.0000 "application " | TOPICAL_OIL | Status: DC | PRN
  Administered 2017-04-13: 1 via TOPICAL
  Filled 2017-04-13: qty 120

## 2017-04-13 MED ORDER — OXYTOCIN 40 UNITS IN LACTATED RINGERS INFUSION - SIMPLE MED: 2.5000 [IU]/h | INTRAVENOUS | Status: DC

## 2017-04-13 MED ORDER — LACTATED RINGERS IV SOLN: 500.0000 mL | Freq: Once | INTRAVENOUS | Status: DC

## 2017-04-13 MED ORDER — WITCH HAZEL-GLYCERIN EX PADS: 1.0000 "application " | MEDICATED_PAD | CUTANEOUS | Status: DC

## 2017-04-13 MED ORDER — SOD CITRATE-CITRIC ACID 500-334 MG/5ML PO SOLN
30.0000 mL | ORAL | Status: DC | PRN
  Administered 2017-04-13: 30 mL via ORAL

## 2017-04-13 MED ORDER — LIDOCAINE-EPINEPHRINE (PF) 1.5 %-1:200000 IJ SOLN
INTRAMUSCULAR | Status: DC | PRN
  Administered 2017-04-13: 3 mL via PERINEURAL

## 2017-04-13 MED ORDER — ONDANSETRON HCL 4 MG PO TABS: 4.0000 mg | ORAL_TABLET | ORAL | Status: DC | PRN

## 2017-04-13 NOTE — Anesthesia Procedure Notes (Signed)
Epidural Patient location during procedure: OB Start time: 04/13/2017 5:20 PM End time: 04/13/2017 5:24 PM  Staffing Anesthesiologist: Berdine Addisonhomas, Mathai, MD Resident/CRNA: Stormy Fabianurtis, Branch Pacitti, CRNA Performed: resident/CRNA   Preanesthetic Checklist Completed: patient identified, site marked, surgical consent, pre-op evaluation, timeout performed, IV checked, risks and benefits discussed and monitors and equipment checked  Epidural Patient position: sitting Prep: Betadine Patient monitoring: heart rate, continuous pulse ox and blood pressure Approach: midline Location: L4-L5 Injection technique: LOR saline  Needle:  Needle type: Tuohy  Needle gauge: 17 G Needle length: 9 cm and 9 Needle insertion depth: 7 cm Catheter type: closed end flexible Catheter size: 19 Gauge Catheter at skin depth: 12 cm Test dose: negative and 1.5% lidocaine with Epi 1:200 K  Assessment Sensory level: T10 Events: blood not aspirated, injection not painful, no injection resistance, negative IV test and no paresthesia  Additional Notes Pt. Evaluated and documentation done after procedure finished. Patient identified. Risks/Benefits/Options discussed with patient including but not limited to bleeding, infection, nerve damage, paralysis, failed block, incomplete pain control, headache, blood pressure changes, nausea, vomiting, reactions to medication both or allergic, itching and postpartum back pain. Confirmed with bedside nurse the patient's most recent platelet count. Confirmed with patient that they are not currently taking any anticoagulation, have any bleeding history or any family history of bleeding disorders. Patient expressed understanding and wished to proceed. All questions were answered. Sterile technique was used throughout the entire procedure. Please see nursing notes for vital signs. Test dose was given through epidural catheter and negative prior to continuing to dose epidural or start infusion.  Warning signs of high block given to the patient including shortness of breath, tingling/numbness in hands, complete motor block, or any concerning symptoms with instructions to call for help. Patient was given instructions on fall risk and not to get out of bed. All questions and concerns addressed with instructions to call with any issues or inadequate analgesia.   Patient tolerated the insertion well without immediate complications.Reason for block:procedure for pain

## 2017-04-13 NOTE — Anesthesia Preprocedure Evaluation (Signed)
Anesthesia Evaluation  Patient identified by MRN, date of birth, ID band Patient awake    Reviewed: Allergy & Precautions, NPO status , Patient's Chart, lab work & pertinent test results, reviewed documented beta blocker date and time   Airway Mallampati: III  TM Distance: >3 FB     Dental  (+) Chipped   Pulmonary           Cardiovascular      Neuro/Psych    GI/Hepatic   Endo/Other    Renal/GU      Musculoskeletal   Abdominal   Peds  Hematology   Anesthesia Other Findings Fragile X chromosome.  Reproductive/Obstetrics                             Anesthesia Physical Anesthesia Plan  ASA: II  Anesthesia Plan: Epidural   Post-op Pain Management:    Induction:   PONV Risk Score and Plan:   Airway Management Planned:   Additional Equipment:   Intra-op Plan:   Post-operative Plan:   Informed Consent: I have reviewed the patients History and Physical, chart, labs and discussed the procedure including the risks, benefits and alternatives for the proposed anesthesia with the patient or authorized representative who has indicated his/her understanding and acceptance.     Plan Discussed with: CRNA  Anesthesia Plan Comments:         Anesthesia Quick Evaluation

## 2017-04-13 NOTE — Discharge Summary (Signed)
Obstetrical Discharge Summary  Patient Name: Madeline Hoffman DOB: Aug 05, 1990 MRN: [id number]  Date of Admission: 04/13/2017 Date of Delivery: 04/13/17 Delivered by: Ranae Plumber, MD Date of Discharge: 04/14/2017  Primary OB: [facility]   WUJ:[id number]'W last menstrual period was 07/09/2016. EDC Estimated Date of Delivery: 04/15/17 Gestational Age at Delivery: [redacted]w[redacted]d   Antepartum complications:  1. Low PAPP-A 2. History of macrosomia 3. 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 Indication for care in labor and delivery, antepartum 04/13/2017  . Encounter for induction of labor 04/13/2017  . 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 First trimester screening     Augmentation: AROM and Pitocin Complications: None Intrapartum complications/course:  Mom presented to L&D with IOL for low PAPP-A,  Pitocin and AROM.  epidual placed. Progressed to complete, second stage: , with delivery of fetal head with restitution to LOT.   Anterior then posterior shoulders delivered without difficulty.  Baby placed on mom's chest, and attended to by peds.  Cord was then clamped and cut immediately, with collection of cord blood for donation.  Placenta spontaneously delivered, intact.   IV pitocin given for hemorrhage prophylaxis. Date of Delivery: 04/13/17 Delivered By: Leeroy Bock Ward Delivery Type: spontaneous vaginal delivery Anesthesia: epidural Placenta: sponatneous Laceration:  Episiotomy: none Newborn Data: Live born female "Natalia Leatherwood" Birth Weight: 8 lb 5.7 oz (3790 g) APGAR: 8, 9  Newborn Delivery   Birth date/time:  04/13/2017 18:05:00 Delivery type:  Vaginal, Spontaneous     Postpartum Procedures: none  Post partum course:  Patient had an uncomplicated postpartum course.  By time of discharge on PPD#1, her pain was controlled on oral pain medications; she had appropriate lochia  and was ambulating, voiding without difficulty and tolerating regular diet.  She was deemed stable for discharge to home.      Discharge Physical Exam:  BP 116/62 (BP Location: Right Arm)   Pulse (!) 57   Temp 97.7 F (36.5 C) (Oral)   Resp 16   Ht 5\' 9"  (1.753 m)   Wt 93.4 kg (206 lb)   LMP 07/09/2016   SpO2 100%   Breastfeeding? Unknown   BMI 30.42 kg/m   General: NAD CV: RRR Pulm: CTABL, nl effort ABD: s/nd/nt, fundus firm and below the umbilicus Lochia: moderate DVT Evaluation: LE non-ttp, no evidence of DVT on exam.  Hemoglobin  Date Value Ref Range Status  04/14/2017 11.5 (L) 12.0 - 16.0 g/dL Final  29/56/2130 86.5 11.1 - 15.9 g/dL Final   HCT  Date Value Ref Range Status  04/14/2017 32.9 (L) 35.0 - 47.0 % Final   Hematocrit  Date Value Ref Range Status  06/15/2016 36.8 34.0 - 46.6 % Final     Disposition: stable, discharge to home. Baby Feeding: breastmilk Baby Disposition: home with mom  Rh Immune globulin given: n/a Rubella vaccine given: n/a Tdap vaccine given in AP or PP setting: AP Flu vaccine given in AP or PP setting: AP  Contraception: family planning/condoms  Prenatal Labs:     Plan:  Madeline Hoffman was discharged to home in good condition. Follow-up appointment with Dr. [name] in 6 weeks.  Discharge Medications: Allergies as of 04/14/2017   No Known Allergies     Medication List    STOP taking these medications   folic acid 1 MG tablet Commonly known as:  FOLVITE     TAKE these medications   acetaminophen  500 MG tablet Commonly known as:  TYLENOL Take 2 tablets (1,000 mg total) by mouth every 8 (eight) hours as needed for up to 3 days for moderate pain.   docusate sodium 100 MG capsule Commonly known as:  COLACE Take 1 capsule (100 mg total) by mouth 2 (two) times daily for 14 days. To keep stools soft, as needed   ferrous sulfate 325 (65 FE) MG tablet Take 1 tablet (325 mg total) by mouth daily with breakfast. Take with  Vitamin C   ibuprofen 800 MG tablet Commonly known as:  ADVIL,MOTRIN Take 1 tablet (800 mg total) by mouth every 8 (eight) hours as needed for moderate pain or cramping.   prenatal multivitamin Tabs tablet Take 1 tablet by mouth daily at 12 noon.         Signed: Christeen DouglasBethany Jhoselin Crume 04/14/17

## 2017-04-13 NOTE — Progress Notes (Signed)
Intrapartum progress note  S: patient doing well, thinking about an epidural  O: 110/72 65 97.8 FHT: 120 mod + accels no decels TOCO: q2-294min, pit @ 9 SVE: 4/80/0 AROM for clear blood-tinged fluid  A/P: 26yo G3P2002 @ 39+5 with IOL for low PAPP-A  1. IOL: continue pit after AROM 2. IUP: category 1 3. Anticipate vaginal delivery.  ----- Ranae Plumberhelsea Brendaliz Kuk, MD Attending Obstetrician and Gynecologist [facility], Department of OB/GYN [facility]

## 2017-04-13 NOTE — H&P (Signed)
OB History & Physical   History of Present Illness:  Chief Complaint:   HPI:  Madeline Hoffman is a 27 y.o. [id number] female at 819w5d dated by Patient's last menstrual period was 07/09/2016. With Estimated Date of Delivery: 04/15/17  She presents to L&D for IOL at term due to low PAPP-A with risk of pregnancy complications.  +FM, no CTX, no LOF, no VB  Pregnancy Issues: 1. Low PAPP-A 2. 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 Medical history non-contributory     Past Surgical History:  Procedure Laterality Date  . GALLBLADDER SURGERY    . TONSILLECTOMY    . WISDOM TOOTH EXTRACTION      No Known Allergies  Prior to Admission medications   Medication Sig Start Date End Date Taking? Authorizing Provider  folic acid (FOLVITE) 1 MG tablet Take 1 mg by mouth daily.    [provider]  Prenatal Vit-Fe Fumarate-FA (PRENATAL MULTIVITAMIN) TABS tablet Take 1 tablet by mouth daily at 12 noon.    [provider]     [facility]    Social History: She  reports that  has never smoked. she has never used smokeless tobacco. She reports that she does not drink alcohol or use drugs.  Family History: family history includes Asthma in her sister; Hypertension in her father and mother; Thyroid disease in her sister.   Review of Systems: A full review of systems was performed and negative except as noted in the HPI.     Physical Exam:  Vital Signs: BP 118/69 (BP Location: Left Arm)   Pulse 76   Temp 98.1 F (36.7 C) (Oral)   Resp 18   Ht 5\' 9"  (1.753 m)   Wt 93.4 kg (206 lb)   LMP 07/09/2016   BMI 30.42 kg/m  General: no acute distress.  HEENT: normocephalic, atraumatic Heart: regular rate & rhythm.  No murmurs/rubs/gallops Lungs: clear to auscultation bilaterally, normal respiratory effort Abdomen: soft, gravid,  non-tender;  EFW: 8.5lbs Pelvic:   External: Normal external female genitalia  Cervix: Dilation: 4 / Effacement (%): 80 / Station: 0    Extremities: non-tender, symmetric, no edema bilaterally.  DTRs: 2+ Neurologic: Alert & oriented x 3.    No results found for this or any previous visit (from the past 24 hour(s)).  Pertinent Results:  Prenatal Labs: Blood type/Rh A+  Antibody screen neg  Rubella Immune  Varicella Immune  RPR NR  HBsAg Neg  HIV NR  GC neg  Chlamydia neg  Genetic screening negative  1 hour GTT 88  3 hour GTT --  GBS negative   FHT: 125 mod + accels one variable. tachycardic on admission with a variable to 90s TOCO: SVE:  Dilation: 4 / Effacement (%): 80 / Station: 0    Cephalic by leopolds   Assessment:  Madeline Hoffman is a 27 y.o. [phone] female at 6919w5d with .   Plan:  1. Admit to Labor & Delivery 2. CBC, T&S, Clrs, IVF 3. GBS neg   4. Consents obtained. 5. Continuous efm/toco 6. Category 2 tracing 7. IOL: pitocin with plan for future AROM    ----- Ranae Plumberhelsea Ward, MD Attending Obstetrician and Gynecologist [facility], Department of OB/GYN [facility]

## 2017-04-14 LAB — CBC
HCT: 32.9 % — ABNORMAL LOW (ref 35.0–47.0)
Hemoglobin: 11.5 g/dL — ABNORMAL LOW (ref 12.0–16.0)
MCH: 32 pg (ref 26.0–34.0)
MCHC: 34.8 g/dL (ref 32.0–36.0)
MCV: 91.8 fL (ref 80.0–100.0)
PLATELETS: 157 10*3/uL (ref 150–440)
RBC: 3.59 MIL/uL — AB (ref 3.80–5.20)
RDW: 13 % (ref 11.5–14.5)
WBC: 9.8 10*3/uL (ref 3.6–11.0)

## 2017-04-14 LAB — RPR: RPR: NONREACTIVE

## 2017-04-14 MED ORDER — IBUPROFEN 800 MG PO TABS: 800.0000 mg | ORAL_TABLET | Freq: Three times a day (TID) | ORAL | 1 refills | Status: DC | PRN

## 2017-04-14 MED ORDER — FERROUS SULFATE 325 (65 FE) MG PO TABS: 325.0000 mg | ORAL_TABLET | Freq: Every day | ORAL | 1 refills | Status: AC

## 2017-04-14 MED ORDER — ACETAMINOPHEN 500 MG PO TABS: 1000.0000 mg | ORAL_TABLET | Freq: Three times a day (TID) | ORAL | 0 refills | Status: AC | PRN

## 2017-04-14 MED ORDER — DOCUSATE SODIUM 100 MG PO CAPS: 100.0000 mg | ORAL_CAPSULE | Freq: Two times a day (BID) | ORAL | 0 refills | Status: AC

## 2017-04-14 NOTE — Discharge Instructions (Signed)
Care After Vaginal Delivery °Congratulations on your new baby!! ° °Refer to this sheet in the next few weeks. These discharge instructions provide you with information on caring for yourself after delivery. Your caregiver may also give you specific instructions. Your treatment has been planned according to the most current medical practices available, but problems sometimes occur. Call your caregiver if you have any problems or questions after you go home. ° °HOME CARE INSTRUCTIONS °· Take over-the-counter or prescription medicines only as directed by your caregiver or pharmacist. °· Do not drink alcohol, especially if you are breastfeeding or taking medicine to relieve pain. °· Do not chew or smoke tobacco. °· Do not use illegal drugs. °· Continue to use good perineal care. Good perineal care includes: °¨ Wiping your perineum from front to back. °¨ Keeping your perineum clean. °· Do not use tampons or douche until your caregiver says it is okay. °· Shower, wash your hair, and take tub baths as directed by your caregiver. °· Wear a well-fitting bra that provides breast support. °· Eat healthy foods. °· Drink enough fluids to keep your urine clear or pale yellow. °· Eat high-fiber foods such as whole grain cereals and breads, brown rice, beans, and fresh fruits and vegetables every day. These foods may help prevent or relieve constipation. °· Follow your caregiver's recommendations regarding resumption of activities such as climbing stairs, driving, lifting, exercising, or traveling. Specifically, no driving for two weeks, so that you are comfortable reacting quickly in an emergency. °· Talk to your caregiver about resuming sexual activities. Resumption of sexual activities is dependent upon your risk of infection, your rate of healing, and your comfort and desire to resume sexual activity. Usually we recommend waiting about six weeks, or until your bleeding stops and you are interested in sex. °· Try to have someone  help you with your household activities and your newborn for at least a few days after you leave the hospital. Even longer is better. °· Rest as much as possible. Try to rest or take a nap when your newborn is sleeping. Sleep deprivation can be very hard after delivery. °· Increase your activities gradually. °· Keep all of your scheduled postpartum appointments. It is very important to keep your scheduled follow-up appointments. At these appointments, your caregiver will be checking to make sure that you are healing physically and emotionally. ° °SEEK MEDICAL CARE IF:  °· You are passing large clots from your vagina.  °· You have a foul smelling discharge from your vagina. °· You have trouble urinating. °· You are urinating frequently. °· You have pain when you urinate. °· You have a change in your bowel movements. °· You have increasing redness, pain, or swelling near your vaginal incision (episiotomy) or vaginal tear. °· You have pus draining from your episiotomy or vaginal tear. °· Your episiotomy or vaginal tear is separating. °· You have painful, hard, or reddened breasts. °· You have a severe headache. °· You have blurred vision or see spots. °· You feel sad or depressed. °· You have thoughts of hurting yourself or your newborn. °· You have questions about your care, the care of your newborn, or medicines. °· You are dizzy or light-headed. °· You have a rash. °· You have nausea or vomiting. °· You were breastfeeding and have not had a menstrual period within 12 weeks after you stopped breastfeeding. °· You are not breastfeeding and have not had a menstrual period by the 12th week after delivery. °· You   have a fever. ° °SEEK IMMEDIATE MEDICAL CARE IF:  °· You have persistent pain. °· You have chest pain. °· You have shortness of breath. °· You faint. °· You have leg pain. °· You have stomach pain. °· Your vaginal bleeding saturates two or more sanitary pads in 1 hour. ° °MAKE SURE YOU:  °· Understand these  instructions. °· Will get help right away if you are not doing well or get worse. °·  °Document Released: 01/14/2000 Document Revised: 06/02/2013 Document Reviewed: 09/13/2011 ° °ExitCare® Patient Information ©2015 [employer]. This information is not intended to replace advice given to you by your health care provider. Make sure you discuss any questions you have with your health care provider. ° °

## 2017-04-14 NOTE — Progress Notes (Signed)
Reviewed D/C instructions with pt and family. Pt verbalized understanding of teaching. Pt to schedule f/u appt. Plan to discharge after baby's 24 hr screening completed.

## 2017-04-14 NOTE — Anesthesia Postprocedure Evaluation (Signed)
Anesthesia Post Note  Patient: Madeline ParkinLindsay Hoffman  Procedure(s) Performed: AN AD HOC LABOR EPIDURAL  Patient location during evaluation: Mother Baby Anesthesia Type: Epidural Level of consciousness: awake and alert and oriented Pain management: pain level controlled Vital Signs Assessment: post-procedure vital signs reviewed and stable Respiratory status: spontaneous breathing, nonlabored ventilation and respiratory function stable Cardiovascular status: stable Postop Assessment: no headache, no backache, epidural receding and no signs of nausea or vomiting (no pruritis) Anesthetic complications: no     Last Vitals:  Vitals:   04/14/17 0415 04/14/17 0834  BP: (!) 94/49 116/62  Pulse: (!) 48 (!) 57  Resp: 16 16  Temp: 36.6 C 36.5 C  SpO2: 99% 100%    Last Pain:  Vitals:   04/14/17 0834  TempSrc: Oral  PainSc:                  [name]

## 2017-04-19 NOTE — Telephone Encounter (Signed)
Error

## 2019-01-31 NOTE — L&D Delivery Note (Signed)
Delivery Note  Madeline Hoffman is a [id number] at [redacted]w[redacted]d with an LMP of 12/28/202, inconsistent with [place] at [redacted]w[redacted]d.   First Stage: Labor onset: 1800 11/20/19 Augmentation: misoprostol, oxytocin  Analgesia Eliezer Lofts intrapartum: Comfort measures  PROM at 2000 on 11/19/19  Second Stage: Complete dilation at 2252 Onset of pushing at 2252 FHR second stage 155bpm, moderate variability   Madeline Hoffman presented to L&D with PROM the previous evening.  She was having irregular contractions but nothing consistent.  She was expectantly managed initially and then misoprostol was used for augmentation.  Oxytocin was initiated next for augmentation and discontinued during transition.  She used labor comfort measures to include breathing, relaxation, movement, positioning, and partner remained supportive at bedside.  Madeline Hoffman progressed to C/C/+2 while in hands and knees with a spontaneous urge to push.  She pushed effectively over 3 contractions for a spontaneous birth of a surprise baby boy.   Delivery of a viable baby boy on 11/20/19 at 2259 by CNM Delivery of fetal head in OA position with restitution to ROT. Nuchal cord x 1;  Anterior then posterior shoulders delivered easily with gentle downward traction. Infant somersaulted through nuchal cord to maternal left thigh, then nuchal cord reduced.  Infant passed through mom's legs and assisted to maternal chest.  Madeline Hoffman and baby were assisted from hands and knees to semi-fowlers. Baby placed on mom's chest, and attended to by baby nurse  Cord double clamped after cessation of pulsation, cut by father of baby.   Cord blood sample collected: A Pos - not indicated   Third Stage: Oxytocin bolus started after delivery of infant for hemorrhage prophylaxis  Placenta delivered intact with 3 VC @ 2308 Placenta disposition: discarded  Uterine tone firm / bleeding moderate  1st degree vaginal laceration identified, hemostatic  Anesthesia for repair: None  Repair - not  indicated, laceration hemostatic  Est. Blood Loss (mL): 250 ml  Complications: none  Mom to postpartum.  Baby to Couplet care / Skin to Skin.  Newborn: Information for the patient's newborn:  Holford, Boy Lainee [[id number]]  Live born female  Birth Weight: Pending   APGAR: 45, 9   Newborn Delivery   Birth date/time: 11/20/2019 22:59:00 Delivery type: Vaginal, Spontaneous     Feeding planned: Breast   ---------- Margaretmary Eddy, CNM Certified Nurse Midwife [facility]

## 2019-04-10 DIAGNOSIS — Z3490 Encounter for supervision of normal pregnancy, unspecified, unspecified trimester: Secondary | ICD-10-CM | POA: Insufficient documentation

## 2019-05-05 LAB — OB RESULTS CONSOLE HIV ANTIBODY (ROUTINE TESTING): HIV: NONREACTIVE

## 2019-05-05 LAB — OB RESULTS CONSOLE GC/CHLAMYDIA
Chlamydia: NEGATIVE
Gonorrhea: NEGATIVE

## 2019-05-05 LAB — OB RESULTS CONSOLE VARICELLA ZOSTER ANTIBODY, IGG: Varicella: IMMUNE

## 2019-05-05 LAB — OB RESULTS CONSOLE RUBELLA ANTIBODY, IGM: Rubella: IMMUNE

## 2019-05-05 LAB — OB RESULTS CONSOLE RPR: RPR: NONREACTIVE

## 2019-05-05 LAB — OB RESULTS CONSOLE HEPATITIS B SURFACE ANTIGEN: Hepatitis B Surface Ag: NEGATIVE

## 2019-05-08 ENCOUNTER — Other Ambulatory Visit: Payer: Self-pay | Admitting: Certified Nurse Midwife

## 2019-05-08 DIAGNOSIS — Z369 Encounter for antenatal screening, unspecified: Secondary | ICD-10-CM

## 2019-05-12 ENCOUNTER — Other Ambulatory Visit: Payer: Self-pay

## 2019-05-12 ENCOUNTER — Ambulatory Visit
Admission: RE | Admit: 2019-05-12 | Discharge: 2019-05-12 | Disposition: A | Discharge status: Home / Self Care | Payer: BC Managed Care – PPO | Source: Ambulatory Visit | Attending: Obstetrics and Gynecology | Admitting: Obstetrics and Gynecology

## 2019-05-12 ENCOUNTER — Ambulatory Visit: Payer: BC Managed Care – PPO

## 2019-05-12 ENCOUNTER — Ambulatory Visit: Payer: BLUE CROSS/BLUE SHIELD

## 2019-05-12 DIAGNOSIS — Z148 Genetic carrier of other disease: Secondary | ICD-10-CM

## 2019-05-12 NOTE — Progress Notes (Signed)
Referring physician:  Haroldine Laws, [facility] Length of consultation:  20 minutes  Ms. Holford was referred for genetic counseling due to a known history of Fragile X in her family.  (see note from 10/05/2016 for details). 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  The family was initially diagnosed when one of her sisters had premature ovarian insufficiency at age 25 years.  At that same time, her paternal uncle was diagnosed with a movement disorder, later to be determined to be ataxia associated with Fragile X.  Since that time, her two other sisters and one paternal cousin have experienced premature ovarian failure.  The cousin has a son with autism and Fragile X syndrome.  Ms. Sloma and her husband have three children and experienced one chemical pregnancy and two early miscarriages. The first son Chrissie Noa), now 67 years old, is reported to have a normal copy number and normal growth and development.  The second son Billey Gosling), now 10 years old, is reported to have a copy number of 190-198 and has normal growth and development.  Their daughter, Santina Evans, now 31 years old is reported by the patient to have a copy number of 213 based upon testing through the Early Check study in [place] at the time of Newborn screening.  Thus far, there have been no concerns about her development. She has not had a formal Genetics Consultation for these results..  Ms. Zeiders is trying to get copies of the results of testing on herself and the children for completeness of their records here in [place]. We strongly recommend the family be connected with the Fragile X Clinic at [facility] and provided the patient with the contact information (call Armen Pickup, [facility],Tel: [phone]) to schedule an appointment with Dr. Sinclair Grooms. She plans to email or fax records to me from their testing if possible.  We revisited the following information  specific to this pregnancy: Ms. Heidel is aware that this pregnancy has a 50% chance for inheriting her normal copy of the gene for Fragile X and a 50% risk for inheriting her expanded allele.  The expanded allele could remain as a premutation or expand to a full mutation (>200 copies). The chance for expansion to >200 repeats is estimated to be 59%.   The remainder of the family history was reported to be unremarkable for birth defects, intellectual delays, recurrent pregnancy loss or known chromosome abnormalities. She reported no complications or exposures in this pregnancy to medications, alcohol, tobacco or recreational drugs that would be expected to increase the risk for birth defects.  As background, Fragile X syndrome is the most common inherited cause for intellectual disabilities in males.  Fragile X syndrome is caused by a change, or expansion, of a repeating segment of the DNA bases CGG in the FMR1 gene.  This gene is located on the X chromosome.  Females have two X chromosomes and males have one X and one Y chromosome.  Because males have only one copy of this chromosome, changes typically result in more severe features in males.  Carrier screening is used to determine the number of repeats in this region of the gene and to determine the likelihood of having a child with this condition.  This chance is based, in part, on the number of CGG repeats identified.  This repeat tract is susceptible to expansion as it is passed on from generation to generation in a family,  particularly during oogenesis.  Once this expansion reaches a specific size, an individual may be at risk to have a child with Fragile X syndrome.  The repeat sizes are classified into ranges, and risk is determined based on the size of the expansion.  These repeat sizes are classified as normal; with up to 44 repeats, gray zone; 45-54 repeats, premutation; 55-200 repeats, and full mutation; over 200 repeats.  Individuals with full  mutations are expected to be affected with Fragile X syndrome.  Males with Fragile X syndrome typically have intellectual disabilities, autistic features and characteristic facial features.  Females may also show features of Fragile X syndrome, though the features are often more mild than those seen in males. Due to her carrier status, we discussed the option of testing the baby during pregnancy for the repeat size.  This could be performed through an amniocentesis.  Amniocentesis involves the removal of a small amount of amniotic fluid from the sac surrounding the fetus with the use of a thin needle inserted through the maternal abdomen and uterus.  Ultrasound guidance is used throughout the procedure.  Fetal cells from amniotic fluid are directly evaluated for the number of CGG repeats.  In addition, > 99.5% of chromosome problems and > 98% of open neural tube defects can be detected. This procedure is generally performed after the 15th week of pregnancy.  The main risks to this procedure include complications leading to miscarriage in less than 1 in 200 cases (0.5%).  Lastly, we reviewed possible health consequences for premutation Fragile X carriers.  Women who carry a premutation are at an increased chance for premature ovarian insufficiency.  In approximately 20% of female carriers, there is reduced fertility with periods ending before the age of 31.  Carriers are also at risk for an adult onset (>74 years old) neurodegenerative disorder which includes tremor and ataxia (abnormal gait) known at FXTAS (Fragile X Tremor and Ataxia Syndrome).  It is important that Ms. Pillard make her primary care doctors aware of this history and that the family remain connected with physicians who have knowledge of Fragile X syndrome throughout the lifespan.  At this time, Ms. Cardarelli has decided that they do not want testing during this pregnancy for Fragile X and they do not desire to know gender of the baby.  Prior  screening ordered through [facility] for chromosome conditions with MaterniT21 testing was negative. The patient was reminded to not view these results in My Chart, as the gender is clearly documented in the results.   We may be reached at [phone] with any questions or concerns.  Wilburt Finlay, MS, CGC

## 2019-06-24 ENCOUNTER — Emergency Department: Payer: BC Managed Care – PPO

## 2019-06-24 ENCOUNTER — Other Ambulatory Visit: Payer: Self-pay

## 2019-06-24 ENCOUNTER — Emergency Department
Admission: EM | Admit: 2019-06-24 | Discharge: 2019-06-24 | Disposition: A | Discharge status: Home / Self Care | Payer: BC Managed Care – PPO | Attending: Emergency Medicine | Admitting: Emergency Medicine

## 2019-06-24 DIAGNOSIS — Z3A19 19 weeks gestation of pregnancy: Secondary | ICD-10-CM | POA: Insufficient documentation

## 2019-06-24 DIAGNOSIS — O3462 Maternal care for abnormality of vagina, second trimester: Secondary | ICD-10-CM | POA: Insufficient documentation

## 2019-06-24 DIAGNOSIS — N898 Other specified noninflammatory disorders of vagina: Secondary | ICD-10-CM | POA: Diagnosis not present

## 2019-06-24 DIAGNOSIS — Z79899 Other long term (current) drug therapy: Secondary | ICD-10-CM | POA: Diagnosis not present

## 2019-06-24 LAB — URINALYSIS, COMPLETE (UACMP) WITH MICROSCOPIC
Bacteria, UA: NONE SEEN
Bilirubin Urine: NEGATIVE
Glucose, UA: NEGATIVE mg/dL
Hgb urine dipstick: NEGATIVE
Ketones, ur: NEGATIVE mg/dL
Leukocytes,Ua: NEGATIVE
Nitrite: NEGATIVE
Protein, ur: NEGATIVE mg/dL
Specific Gravity, Urine: 1.025 (ref 1.005–1.030)
Squamous Epithelial / LPF: NONE SEEN (ref 0–5)
pH: 5 (ref 5.0–8.0)

## 2019-06-24 LAB — HCG, QUANTITATIVE, PREGNANCY: hCG, Beta Chain, Quant, S: 17607 m[IU]/mL — ABNORMAL HIGH (ref <5)

## 2019-06-24 LAB — WET PREP, GENITAL
Clue Cells Wet Prep HPF POC: NONE SEEN
Sperm: NONE SEEN
Trich, Wet Prep: NONE SEEN
Yeast Wet Prep HPF POC: NONE SEEN

## 2019-06-24 LAB — CHLAMYDIA/NGC RT PCR (ARMC ONLY)
Chlamydia Tr: NOT DETECTED
N gonorrhoeae: NOT DETECTED

## 2019-06-24 LAB — BASIC METABOLIC PANEL
Anion gap: 6 (ref 5–15)
BUN: 13 mg/dL (ref 6–20)
CO2: 22 mmol/L (ref 22–32)
Calcium: 8.2 mg/dL — ABNORMAL LOW (ref 8.9–10.3)
Chloride: 107 mmol/L (ref 98–111)
Creatinine, Ser: 0.54 mg/dL (ref 0.44–1.00)
GFR calc Af Amer: >60 mL/min (ref >60)
GFR calc non Af Amer: >60 mL/min (ref >60)
Glucose, Bld: 78 mg/dL (ref 70–99)
Potassium: 3.5 mmol/L (ref 3.5–5.1)
Sodium: 135 mmol/L (ref 135–145)

## 2019-06-24 LAB — CBC
HCT: 32.6 % — ABNORMAL LOW (ref 36.0–46.0)
Hemoglobin: 11.3 g/dL — ABNORMAL LOW (ref 12.0–15.0)
MCH: 31.4 pg (ref 26.0–34.0)
MCHC: 34.7 g/dL (ref 30.0–36.0)
MCV: 90.6 fL (ref 80.0–100.0)
Platelets: 184 10*3/uL (ref 150–400)
RBC: 3.6 MIL/uL — ABNORMAL LOW (ref 3.87–5.11)
RDW: 12.6 % (ref 11.5–15.5)
WBC: 8.2 10*3/uL (ref 4.0–10.5)
nRBC: 0 % (ref 0.0–0.2)

## 2019-06-24 MED ORDER — SODIUM CHLORIDE 0.9% FLUSH: 3.0000 mL | Freq: Once | INTRAVENOUS | Status: DC

## 2019-06-24 NOTE — ED Provider Notes (Signed)
[facility] Emergency Department Provider Note  Time seen: 7:57 PM  I have reviewed the triage vital signs and the nursing notes.   HISTORY  Chief Complaint Vaginal Discharge    HPI Madeline Hoffman is a 29 y.o. female G6, P3 A2 presents to the emergency department approximately [redacted] weeks pregnant with vaginal discharge.  According to the patient for the past 4 days or so she has noticed intermittent discharge.  Patient states at first she was not sure if this is vaginal, urine, or possibly due to shower water.  Patient states however it is happened 1 or 2 additional times over the past 4 days so she called her OB and they recommended she come to the emergency department for evaluation.  Patient denies any possibility of STDs.  Patient denies abdominal pain.  Largely negative review of systems.   Past Medical History:  Diagnosis Date  . 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 Medical history non-contributory     Patient Active Problem List   Diagnosis Date Noted  . Indication for care in labor and delivery, antepartum 04/13/2017  . Encounter for induction of labor 04/13/2017  . 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 First trimester screening     Past Surgical History:  Procedure Laterality Date  . GALLBLADDER SURGERY    . TONSILLECTOMY    . WISDOM TOOTH EXTRACTION      Prior to Admission medications   Medication Sig Start Date End Date Taking? Authorizing Provider  ferrous sulfate 325 (65 FE) MG tablet Take 1 tablet (325 mg total) by mouth daily with breakfast. Take with Vitamin C 04/14/17 06/13/17  Christeen Douglas, MD  ibuprofen (ADVIL,MOTRIN) 800 MG tablet Take 1 tablet (800 mg total) by mouth every 8 (eight) hours as needed for moderate pain or cramping. 04/14/17   Christeen Douglas, MD  Prenatal Vit-Fe Fumarate-FA (PRENATAL MULTIVITAMIN) TABS tablet Take 1 tablet by mouth daily at 12 noon.    [provider]    No Known Allergies  Family History   Problem Relation Age of Onset  . Hypertension Mother   . Hypertension Father   . Asthma Sister   . Thyroid disease Sister     Social History Social History   Tobacco Use  . Smoking status: Never Smoker  . Smokeless tobacco: Never Used  Substance Use Topics  . Alcohol use: No  . Drug use: No    Review of Systems Constitutional: Negative for fever. Cardiovascular: Negative for chest pain. Respiratory: Negative for shortness of breath. Gastrointestinal: Negative for abdominal pain Genitourinary: Occasional vaginal discharge. Musculoskeletal: Negative for musculoskeletal complaints Neurological: Negative for headache All other ROS negative  ____________________________________________   PHYSICAL EXAM:  VITAL SIGNS: ED Triage Vitals  Enc Vitals Group     BP 06/24/19 1820 120/81     Pulse Rate 06/24/19 1820 78     Resp 06/24/19 1820 20     Temp 06/24/19 1820 98.1 F (36.7 C)     Temp src --      SpO2 06/24/19 1820 99 %     Weight 06/24/19 1823 188 lb (85.3 kg)     Height 06/24/19 1823 5\' 8"  (1.727 m)     Head Circumference --      Peak Flow --      Pain Score 06/24/19 1822 2     Pain Loc --      Pain Edu? --      Excl. in GC? --  Constitutional: Alert and oriented. Well appearing and in no distress. Eyes: Normal exam ENT      Head: Normocephalic and atraumatic.      Mouth/Throat: Mucous membranes are moist. Cardiovascular: Normal rate, regular rhythm. Respiratory: Normal respiratory effort without tachypnea nor retractions. Breath sounds are clear Gastrointestinal: Soft and nontender. No distention. Musculoskeletal: Nontender with normal range of motion in all extremities.  Neurologic:  Normal speech and language. No gross focal neurologic deficits  Skin:  Skin is warm, dry and intact.  Psychiatric: Mood and affect are normal.  ____________________________________________   RADIOLOGY  Ultrasound shows single live IUP measuring 20 weeks and 3 days.   Normal amniotic fluid volume.  ____________________________________________   INITIAL IMPRESSION / ASSESSMENT AND PLAN / ED COURSE  Pertinent labs & imaging results that were available during my care of the patient were reviewed by me and considered in my medical decision making (see chart for details).   Patient presents to the emergency department for vaginal discharge and concern for possible fluid leakage.  Nontender abdomen.  Overall the patient appears well.  On pelvic examination patient does have a mild amount of vaginal discharge no obvious significant leakage of fluid.  pH paper measures around 5, does not appear concerning for rupture of membranes.  I have sent swabs as a precaution.  Ultrasound is reassuring with a live IUP and a subjectively normal amount of amniotic fluid.  Wet prep is negative.  Reassuring work-up.  We will discharge with OB/GYN follow-up.  Madeline Hoffman was evaluated in Emergency Department on 06/24/2019 for the symptoms described in the history of present illness. She was evaluated in the context of the global COVID-19 pandemic, which necessitated consideration that the patient might be at risk for infection with the SARS-CoV-2 virus that causes COVID-19. Institutional protocols and algorithms that pertain to the evaluation of patients at risk for COVID-19 are in a state of rapid change based on information released by regulatory bodies including the CDC and federal and state organizations. These policies and algorithms were followed during the patient's care in the ED.  ____________________________________________   FINAL CLINICAL IMPRESSION(S) / ED DIAGNOSES  Vaginal discharge   Harvest Dark, MD 06/24/19 2046

## 2019-06-24 NOTE — Discharge Instructions (Addendum)
Please follow-up with your OB/GYN for recheck/reevaluation.  Return to the emergency department for any abdominal pain, any increased discharge or sensation of fluid leakage, or any other symptom personally concerning to yourself.

## 2019-06-24 NOTE — ED Triage Notes (Signed)
PT to ED via POV from home c/o potential amniotic fluid leak. PT denies any injury. States first noticed it in the shower and she thought it might have just been shower water. Noticed it again at the store. Drainage is clear and does not have an odor, not enough leakage to warrant a panty liner. PT stats she has been feeling right for a couple days and been extra tired.

## 2019-07-01 ENCOUNTER — Ambulatory Visit: Payer: BC Managed Care – PPO | Attending: Internal Medicine

## 2019-07-01 DIAGNOSIS — Z23 Encounter for immunization: Secondary | ICD-10-CM

## 2019-07-01 NOTE — Progress Notes (Signed)
   Covid-19 Vaccination Clinic  Name:  Madeline Hoffman    MRN: [id number] DOB: 1990/09/11  07/01/2019  [name] was observed post Covid-19 immunization for 15 minutes without incident. She was provided with Vaccine Information Sheet and instruction to access the V-Safe system.   [name] was instructed to call 911 with any severe reactions post vaccine: Marland Kitchen Difficulty breathing  . Swelling of face and throat  . A fast heartbeat  . A bad rash all over body  . Dizziness and weakness   Immunizations Administered    Name Date Dose VIS Date Route   Moderna COVID-19 Vaccine 07/01/2019  5:51 PM 0.5 mL 12/2018 Intramuscular   Manufacturer: Moderna   Lot: [id number]   NDC: 31438-887-57

## 2019-07-29 ENCOUNTER — Ambulatory Visit: Payer: BC Managed Care – PPO | Attending: Internal Medicine

## 2019-07-29 DIAGNOSIS — Z23 Encounter for immunization: Secondary | ICD-10-CM

## 2019-07-29 NOTE — Progress Notes (Signed)
   Covid-19 Vaccination Clinic  Name:  Madeline Hoffman    MRN: [id number] DOB: 13-Sep-1990  07/29/2019  [name] was observed post Covid-19 immunization for 15 minutes without incident. She was provided with Vaccine Information Sheet and instruction to access the V-Safe system.   [name] was instructed to call 911 with any severe reactions post vaccine: Marland Kitchen Difficulty breathing  . Swelling of face and throat  . A fast heartbeat  . A bad rash all over body  . Dizziness and weakness   Immunizations Administered    Name Date Dose VIS Date Route   Moderna COVID-19 Vaccine 07/29/2019  5:07 PM 0.5 mL 12/2018 Intramuscular   Manufacturer: Moderna   Lot: [id number]   NDC: 09233-007-62

## 2019-10-22 LAB — OB RESULTS CONSOLE RPR: RPR: NONREACTIVE

## 2019-10-22 LAB — OB RESULTS CONSOLE GBS: GBS: NEGATIVE

## 2019-10-22 LAB — OB RESULTS CONSOLE HIV ANTIBODY (ROUTINE TESTING): HIV: NONREACTIVE

## 2019-11-19 ENCOUNTER — Other Ambulatory Visit: Payer: Self-pay | Admitting: Obstetrics and Gynecology

## 2019-11-19 NOTE — Progress Notes (Signed)
  History of Present Illness:  Chief Complaint:   HPI:  Madeline Hoffman is a 29 y.o. [phone] female at [redacted]w[redacted]d dated by 8 week u/s.  She presents to L&D for IOL for post-dates IOL.  Pregnancy Issues: 1. History of pregnancy with low PAPP-A 2. 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 History of macrosomia  10/22/19: EFW 2996g, 52%, AFI 63%    Prenatal care site: [facility]    EFW: 10/22/19: EFW 2996g, 52%, AFI 63%     Pertinent Results:  Prenatal Labs: Blood type/Rh A pos  Antibody screen neg  Rubella Immune  Varicella Immune  RPR NR  HBsAg Neg  HIV NR  GC neg  Chlamydia neg  Genetic screening negative  1 hour GTT 91  3 hour GTT   GBS Neg    5. Post Partum Planning: - Flu Received 10/22/19 - Tdap Given 08/27/19  - Covid: 2nd vaccine was given 07/29/2019 - Contraception Plan: NFP and diaphragm  - Feeding Plan: Breast    Fausto Sampedro, CNM 11/19/2019 12:17 PM

## 2019-11-20 ENCOUNTER — Other Ambulatory Visit: Payer: Self-pay

## 2019-11-20 ENCOUNTER — Inpatient Hospital Stay
Admission: EM | Admit: 2019-11-20 | Discharge: 2019-11-22 | DRG: 806 | Disposition: A | Discharge status: Home / Self Care | Payer: BC Managed Care – PPO

## 2019-11-20 DIAGNOSIS — O4202 Full-term premature rupture of membranes, onset of labor within 24 hours of rupture: Secondary | ICD-10-CM | POA: Diagnosis present

## 2019-11-20 DIAGNOSIS — O9081 Anemia of the puerperium: Secondary | ICD-10-CM | POA: Diagnosis not present

## 2019-11-20 DIAGNOSIS — D62 Acute posthemorrhagic anemia: Secondary | ICD-10-CM | POA: Diagnosis not present

## 2019-11-20 DIAGNOSIS — O26893 Other specified pregnancy related conditions, third trimester: Secondary | ICD-10-CM | POA: Diagnosis present

## 2019-11-20 DIAGNOSIS — O4290 Premature rupture of membranes, unspecified as to length of time between rupture and onset of labor, unspecified weeks of gestation: Secondary | ICD-10-CM | POA: Diagnosis present

## 2019-11-20 DIAGNOSIS — Z20822 Contact with and (suspected) exposure to covid-19: Secondary | ICD-10-CM | POA: Diagnosis present

## 2019-11-20 DIAGNOSIS — Q992 Fragile X chromosome: Secondary | ICD-10-CM

## 2019-11-20 DIAGNOSIS — Z3A40 40 weeks gestation of pregnancy: Secondary | ICD-10-CM | POA: Diagnosis not present

## 2019-11-20 LAB — CBC
HCT: 32.7 % — ABNORMAL LOW (ref 36.0–46.0)
Hemoglobin: 11.5 g/dL — ABNORMAL LOW (ref 12.0–15.0)
MCH: 31.7 pg (ref 26.0–34.0)
MCHC: 35.2 g/dL (ref 30.0–36.0)
MCV: 90.1 fL (ref 80.0–100.0)
Platelets: 172 10*3/uL (ref 150–400)
RBC: 3.63 MIL/uL — ABNORMAL LOW (ref 3.87–5.11)
RDW: 12.4 % (ref 11.5–15.5)
WBC: 8.5 10*3/uL (ref 4.0–10.5)
nRBC: 0 % (ref 0.0–0.2)

## 2019-11-20 LAB — TYPE AND SCREEN
ABO/RH(D): A POS
Antibody Screen: NEGATIVE

## 2019-11-20 LAB — RESPIRATORY PANEL BY RT PCR (FLU A&B, COVID)
Influenza A by PCR: NEGATIVE
Influenza B by PCR: NEGATIVE
SARS Coronavirus 2 by RT PCR: NEGATIVE

## 2019-11-20 MED ORDER — FENTANYL CITRATE (PF) 100 MCG/2ML IJ SOLN: 50.0000 ug | INTRAMUSCULAR | Status: DC | PRN

## 2019-11-20 MED ORDER — LACTATED RINGERS IV SOLN
500.0000 mL | INTRAVENOUS | Status: DC | PRN
  Administered 2019-11-20: 500 mL via INTRAVENOUS

## 2019-11-20 MED ORDER — MISOPROSTOL 100 MCG PO TABS
25.0000 ug | ORAL_TABLET | ORAL | Status: DC | PRN
  Filled 2019-11-20: qty 1

## 2019-11-20 MED ORDER — TERBUTALINE SULFATE 1 MG/ML IJ SOLN: 0.2500 mg | Freq: Once | INTRAMUSCULAR | Status: DC | PRN

## 2019-11-20 MED ORDER — LIDOCAINE HCL (PF) 1 % IJ SOLN: 30.0000 mL | INTRAMUSCULAR | Status: DC | PRN

## 2019-11-20 MED ORDER — MISOPROSTOL 25 MCG QUARTER TABLET
ORAL_TABLET | ORAL | Status: AC
  Administered 2019-11-20: 25 ug via ORAL
  Filled 2019-11-20: qty 2

## 2019-11-20 MED ORDER — MISOPROSTOL 100 MCG PO TABS
25.0000 ug | ORAL_TABLET | ORAL | Status: DC
  Filled 2019-11-20 (×3): qty 1

## 2019-11-20 MED ORDER — OXYTOCIN-SODIUM CHLORIDE 30-0.9 UT/500ML-% IV SOLN
2.5000 [IU]/h | INTRAVENOUS | Status: DC
  Administered 2019-11-20: 2.5 [IU]/h via INTRAVENOUS
  Filled 2019-11-20: qty 500

## 2019-11-20 MED ORDER — AMMONIA AROMATIC IN INHA
RESPIRATORY_TRACT | Status: AC
  Filled 2019-11-20: qty 10

## 2019-11-20 MED ORDER — ACETAMINOPHEN 325 MG PO TABS: 650.0000 mg | ORAL_TABLET | ORAL | Status: DC | PRN

## 2019-11-20 MED ORDER — LACTATED RINGERS IV SOLN: INTRAVENOUS | Status: DC

## 2019-11-20 MED ORDER — OXYTOCIN BOLUS FROM INFUSION
333.0000 mL | Freq: Once | INTRAVENOUS | Status: AC
  Administered 2019-11-20: 333 mL via INTRAVENOUS

## 2019-11-20 MED ORDER — OXYTOCIN-SODIUM CHLORIDE 30-0.9 UT/500ML-% IV SOLN
1.0000 m[IU]/min | INTRAVENOUS | Status: DC
  Filled 2019-11-20: qty 500

## 2019-11-20 MED ORDER — OXYTOCIN 10 UNIT/ML IJ SOLN
INTRAMUSCULAR | Status: AC
  Filled 2019-11-20: qty 2

## 2019-11-20 MED ORDER — ONDANSETRON HCL 4 MG/2ML IJ SOLN
4.0000 mg | Freq: Four times a day (QID) | INTRAMUSCULAR | Status: DC | PRN
  Administered 2019-11-20: 4 mg via INTRAVENOUS
  Filled 2019-11-20: qty 2

## 2019-11-20 MED ORDER — MISOPROSTOL 200 MCG PO TABS
ORAL_TABLET | ORAL | Status: AC
  Filled 2019-11-20: qty 4

## 2019-11-20 MED ORDER — LIDOCAINE HCL (PF) 1 % IJ SOLN
INTRAMUSCULAR | Status: AC
  Filled 2019-11-20: qty 30

## 2019-11-20 MED ORDER — MISOPROSTOL 100 MCG PO TABS
25.0000 ug | ORAL_TABLET | ORAL | Status: DC
  Administered 2019-11-20: 25 ug via BUCCAL
  Filled 2019-11-20 (×3): qty 1

## 2019-11-20 MED ORDER — POLYETHYLENE GLYCOL 3350 17 G PO PACK
17.0000 g | PACK | Freq: Every day | ORAL | Status: DC | PRN
  Administered 2019-11-20: 17 g via ORAL
  Filled 2019-11-20 (×2): qty 1

## 2019-11-20 MED ORDER — SOD CITRATE-CITRIC ACID 500-334 MG/5ML PO SOLN: 30.0000 mL | ORAL | Status: DC | PRN

## 2019-11-20 MED ORDER — OXYTOCIN-SODIUM CHLORIDE 30-0.9 UT/500ML-% IV SOLN
1.0000 m[IU]/min | INTRAVENOUS | Status: DC
  Administered 2019-11-20: 2 m[IU]/min via INTRAVENOUS

## 2019-11-20 NOTE — Progress Notes (Signed)
Labor Progress Note  Madeline Hoffman is a 29 y.o. [phone] at [redacted]w[redacted]d by ultrasound admitted for rupture of membranes  Subjective: feeling comfortable.  Reports irregular contractions but nothing consistent   Objective: BP 121/71 (BP Location: Right Arm)   Pulse 63   Temp 98.6 F (37 C) (Oral)   Resp 17   Ht 5\' 9"  (1.753 m)   Wt 94.3 kg   LMP 01/27/2019   SpO2 100%   BMI 30.72 kg/m  Notable VS details: reviewed   Fetal Assessment: FHT:  FHR: 130 bpm, variability: moderate,  accelerations:  Present,  decelerations:  Absent Category/reactivity:  Category I UC:   Irregular, mild contractions  SVE:   2-3/80/-2/soft/post Membrane status: PROM at 2000 Amniotic color: clear   Labs: Lab Results  Component Value Date   WBC 8.5 11/20/2019   HGB 11.5 (L) 11/20/2019   HCT 32.7 (L) 11/20/2019   MCV 90.1 11/20/2019   PLT 172 11/20/2019    Assessment / Plan: Latent labor.  Reviewed options for continued expectant management, active management with misoprostol, or active management with oxytocin.  Reviewed risks/benefits of options.  Madeline Hoffman desires to start with misoprostol.  Vaginal and buccal misoprostol given.  Will reassess in 4-5 hours.   Labor: Early, latent labor Preeclampsia:  No s/s Fetal Wellbeing:  Category I Pain Control:  Labor support without medications I/D:  PROM x 17 hours, afebrile    Lillia Abed, CNM 11/20/2019, 1:41 PM

## 2019-11-20 NOTE — Discharge Summary (Signed)
Obstetrical Discharge Summary  Patient Name: Madeline Hoffman DOB: 06/28/1990 MRN: [id number]  Date of Admission: 11/20/2019 Date of Delivery: 11/20/19 Delivered by: Margaretmary Eddy, CNM  Date of Discharge: 11/22/2019  [facility] DJS:[id number] last menstrual period was 01/27/2019. EDC Estimated Date of Delivery: 11/15/19 Gestational Age at Delivery: [redacted]w[redacted]d   Antepartum complications:  1. History of pregnancy with low PAPP-A, growth scan 10/22/2019 EFW 2996g 52% 2. Fragile X trait: she and 3 of her kids are carriers. Notify peds. 3. Fetal hydronephorosis, u/s 10/22/2019 right fetal renal pelvis 5.29mm. Notify peds.  4. History of macrosomia  Admitting Diagnosis: PROM Secondary Diagnosis: Patient Active Problem List   Diagnosis Date Noted  . NSVD (normal spontaneous vaginal delivery) 11/22/2019  . Acute blood loss anemia 11/22/2019  . 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  She was having irregular contractions but nothing consistent.  She was expectantly managed initially and then misoprostol was used for augmentation.  Oxytocin was initiated next for augmentation and discontinued during transition.  She used labor comfort measures to include breathing, relaxation, movement, positioning, and partner remained supportive at bedside.  Madeline Hoffman progressed to C/C/+2 while in hands and knees with a spontaneous urge to push.  She pushed effectively over 3 contractions for a spontaneous birth of a surprise baby boy.  Delivery Type: spontaneous vaginal delivery Anesthesia: none Placenta: spontaneous Laceration: 1st vaginal laceration, hemostatic, not repaired  Episiotomy: none Newborn Data: Live born female "Jena Gauss" Birth Weight: 3860g  8lbs8.2oz  APGAR: 9, 9   Newborn Delivery   Birth date/time: 11/20/2019 22:59:00 Delivery type:  Vaginal, Spontaneous      Postpartum Procedures: none Edinburgh:  Edinburgh Postnatal Depression Scale Screening Tool 11/21/2019  I have been able to laugh and see the funny side of things. 0  I have looked forward with enjoyment to things. 0  I have blamed myself unnecessarily when things went wrong. 0  I have been anxious or worried for no good reason. 0  I have felt scared or panicky for no good reason. 0  Things have been getting on top of me. 1  I have been so unhappy that I have had difficulty sleeping. 0  I have felt sad or miserable. 0  I have been so unhappy that I have been crying. 0  The thought of harming myself has occurred to me. 0  Edinburgh Postnatal Depression Scale Total 1     Post partum course:  Patient had an uncomplicated postpartum course.  By time of discharge on PPD#2, her pain was controlled on oral pain medications; she had appropriate lochia and was ambulating, voiding without difficulty and tolerating regular diet.  She was deemed stable for discharge to home.    Discharge Physical Exam:  BP 120/79 (BP Location: Left Arm)   Pulse (!) 54   Temp 98.1 F (36.7 C) (Oral)   Resp 18   Ht 5\' 9"  (1.753 m)   Wt 94.3 kg   LMP 01/27/2019   SpO2 100%   Breastfeeding Unknown   BMI 30.72 kg/m   General: NAD CV: RRR Pulm: nl effort ABD: s/nd/nt, fundus firm and below the umbilicus Lochia: moderate Perineum:minimal edema/repair well approximated DVT Evaluation: LE non-ttp, no evidence of DVT on exam.  Hemoglobin  Date Value Ref Range Status  11/21/2019 10.6 (L) 12.0 - 15.0 g/dL Final  11/23/2019 58/85/0277 11.1 - 15.9 g/dL  Final   HCT  Date Value Ref Range Status  11/21/2019 29.9 (L) 36 - 46 % Final   Hematocrit  Date Value Ref Range Status  06/15/2016 36.8 34.0 - 46.6 % Final     Disposition: stable, discharge to home. Baby Feeding: breastmilk Baby Disposition: home with mom  Rh Immune globulin given: Rh pos Rubella vaccine given:  Immune Varivax vaccine given: Immune Flu vaccine given in AP or PP setting: given 10/22/2019 Tdap vaccine given in AP or PP setting: Give 08/27/2019  Contraception: diaphragm   Prenatal Labs:  Blood type/Rh A pos  Antibody screen neg  Rubella Immune  Varicella Immune  RPR NR  HBsAg Neg  HIV NR  GC neg  Chlamydia neg  Genetic screening cfDNA negative  1 hour GTT 91  3 hour GTT N/A  GBS Negative    Plan:  Yaneli Keithley was discharged to home in good condition. Follow-up appointment with delivering provider in 6 weeks.  Discharge Medications: Allergies as of 11/22/2019   No Known Allergies     Medication List    TAKE these medications   ferrous sulfate 325 (65 FE) MG tablet Take 1 tablet (325 mg total) by mouth daily with breakfast. Take with Vitamin C   prenatal multivitamin Tabs tablet Take 1 tablet by mouth daily at 12 noon.        Follow-up Information    Gustavo Lah, CNM. Schedule an appointment as soon as possible for a visit in 6 week(s).   Specialty: Certified Nurse Midwife Why: postpartum visit  Contact information: [address] [place] [address] [phone]               Signed: Cyril Mourning 11/22/2019 9:14 AM

## 2019-11-20 NOTE — Progress Notes (Signed)
Labor Progress Note  Madeline Hoffman is a 29 y.o. [phone] at 101w5d by ultrasound admitted for rupture of membranes  Subjective: still feeling irregular contractions but they have not been increasing in strength or frequency   Objective: BP 117/83 (BP Location: Left Arm)   Pulse (!) 57   Temp 97.9 F (36.6 C) (Oral)   Resp 15   Ht 5\' 9"  (1.753 m)   Wt 94.3 kg   LMP 01/27/2019   SpO2 100%   BMI 30.72 kg/m  Notable VS details: reviewed   Fetal Assessment: FHT:  FHR: 145 bpm, variability: moderate,  accelerations:  Present,  decelerations:  Absent Category/reactivity:  Category I UC:   Irregular mild contractions  SVE:   3-4/70/-1/soft/post Membrane status: PROM at 2000 on 11/19/19 Amniotic color: Clear   Labs: Lab Results  Component Value Date   WBC 8.5 11/20/2019   HGB 11.5 (L) 11/20/2019   HCT 32.7 (L) 11/20/2019   MCV 90.1 11/20/2019   PLT 172 11/20/2019    Assessment / Plan: Early laten labor.  Discussed expectant management versus augmentation with oxytocin.  Reviewed risks and benefits of options.  Madeline Hoffman desires to proceed with augmentation with oxytocin.  Will start oxytocin.  Dr. Lillia Abed updated on plan of care.   Labor: Early, latent labor Preeclampsia:  no s/s Fetal Wellbeing:  Category I Pain Control:  Labor support without medications I/D:  PROM for 22 hours, afebrile, no s/s of infection   Madeline Hoffman, Gustavo Lah 11/20/2019, 5:54 PM

## 2019-11-20 NOTE — H&P (Signed)
OB History & Physical   History of Present Illness:  Chief Complaint: leakage of fluid  HPI:  Madeline Hoffman is a 29 y.o. [phone] female at [redacted]w[redacted]d dated by [redacted]w[redacted]d ultrasound.  She presents to L&D for rupture of membranes last night at 2000 for clear fluid.   She reports:  -active fetal movement -some bloody show -irregular contractions, though strong when they come   Pregnancy Issues: 1. History of pregnancy with low PAPP-A, growth scan 10/22/2019 EFW 2996g 52% 2. Fragile X trait: she and 3 of her kids are carriers. Notify peds. 3. Fetal hydronephorosis, u/s 10/22/2019 right fetal renal pelvis 5.86mm. Notify peds.  4. History of macrosomia,     Maternal Medical History:   Past Medical History:  Diagnosis Date  . 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 Medical history non-contributory     Past Surgical History:  Procedure Laterality Date  . GALLBLADDER SURGERY    . TONSILLECTOMY    . WISDOM TOOTH EXTRACTION      No Known Allergies  Prior to Admission medications   Medication Sig Start Date End Date Taking? Authorizing Provider  Prenatal Vit-Fe Fumarate-FA (PRENATAL MULTIVITAMIN) TABS tablet Take 1 tablet by mouth daily at 12 noon.   Yes [provider]  ferrous sulfate 325 (65 FE) MG tablet Take 1 tablet (325 mg total) by mouth daily with breakfast. Take with Vitamin C 04/14/17 06/13/17  Christeen Douglas, MD  ibuprofen (ADVIL,MOTRIN) 800 MG tablet Take 1 tablet (800 mg total) by mouth every 8 (eight) hours as needed for moderate pain or cramping. Patient not taking: Reported on 11/20/2019 04/14/17   Christeen Douglas, MD     Prenatal care site: [facility]   Social History: She  reports that she has never smoked. She has never used smokeless tobacco. She reports that she does not drink alcohol and does not use drugs.  Family History: family history includes Asthma in her sister; Hypertension in her father and mother; Thyroid disease in her sister.   Review  of Systems: A full review of systems was performed and negative except as noted in the HPI.    Physical Exam:  Vital Signs: BP 121/71 (BP Location: Right Arm)   Pulse 63   Temp 98 F (36.7 C) (Oral)   Resp 17   Ht 5\' 9"  (1.753 m)   Wt 94.3 kg   LMP 01/27/2019   SpO2 100%   BMI 30.72 kg/m   General:   alert, cooperative, appears stated age and no distress  Skin:  normal and no rash or abnormalities  Neurologic:    Alert & oriented x 3  Lungs:   clear to auscultation bilaterally  Heart:   regular rate and rhythm, S1, S2 normal, no murmur, click, rub or gallop  Abdomen:  soft, non-tender; bowel sounds normal; no masses,  no organomegaly  Pelvis:  Exam deferred.  FHT:  145 BPM  Presentations: cephalic  Cervix:  per RN Dana Means at 0630   Dilation: 2cm   Effacement: 80%   Station:  -2   Consistency: soft   Position: posterior  Extremities: : non-tender, symmetric, no edema bilaterally.      EFW: 7lb12oz   No results found for this or any previous visit (from the past 24 hour(s)).  Pertinent Results:  Prenatal Labs: Blood type/Rh A+  Antibody screen neg  Rubella Immune  Varicella Immune  RPR NR  HBsAg Neg  HIV NR  GC neg  Chlamydia neg  Genetic screening MaterniT 21negative  1 hour GTT 91  3 hour GTT n/a  GBS negative   FHT: FHR: 125 bpm, variability: moderate,  accelerations:  Present,  decelerations:  Absent Category/reactivity:  Category I TOCO: occasional  Assessment:  Madeline Hoffman is a 29 y.o. [phone] female at [redacted]w[redacted]d with PROM.   Plan:  1. Admit to Labor & Delivery; consents reviewed and obtained  2. Fetal Well being  - Fetal Tracing: category I - GBS negative - Presentation: cephalic confirmed by Leopold's   3. Routine OB: - Prenatal labs reviewed, as above - Rh positive - CBC & T&S on admit - Clear fluids, IVF  4. Induction of Labor: -  Contractions monitored with external toco in place -  Pelvis proven to 9lb 6oz -  Plan for induction  with pitocin -  Plan for continuous fetal monitoring  -  Maternal pain control as desired: IVPM, regional anesthesia - Anticipate vaginal delivery  5. Post Partum Planning: - Infant feeding: breast - Contraception: NFP and diaphragm  Genia Del, CNM 11/20/2019 7:26 AM ----- Genia Del Certified Nurse Midwife [facility], Department of OB/GYN [facility]

## 2019-11-21 ENCOUNTER — Encounter: Payer: Self-pay | Admitting: Obstetrics and Gynecology

## 2019-11-21 DIAGNOSIS — O35EXX0 Maternal care for other (suspected) fetal abnormality and damage, fetal genitourinary anomalies, not applicable or unspecified: Secondary | ICD-10-CM | POA: Insufficient documentation

## 2019-11-21 DIAGNOSIS — O358XX0 Maternal care for other (suspected) fetal abnormality and damage, not applicable or unspecified: Secondary | ICD-10-CM | POA: Insufficient documentation

## 2019-11-21 LAB — RPR: RPR Ser Ql: NONREACTIVE

## 2019-11-21 LAB — CBC
HCT: 29.9 % — ABNORMAL LOW (ref 36.0–46.0)
Hemoglobin: 10.6 g/dL — ABNORMAL LOW (ref 12.0–15.0)
MCH: 32.4 pg (ref 26.0–34.0)
MCHC: 35.5 g/dL (ref 30.0–36.0)
MCV: 91.4 fL (ref 80.0–100.0)
Platelets: 165 10*3/uL (ref 150–400)
RBC: 3.27 MIL/uL — ABNORMAL LOW (ref 3.87–5.11)
RDW: 12.5 % (ref 11.5–15.5)
WBC: 12.3 10*3/uL — ABNORMAL HIGH (ref 4.0–10.5)
nRBC: 0 % (ref 0.0–0.2)

## 2019-11-21 MED ORDER — IBUPROFEN 600 MG PO TABS
600.0000 mg | ORAL_TABLET | Freq: Four times a day (QID) | ORAL | Status: DC
  Administered 2019-11-21 – 2019-11-22 (×6): 600 mg via ORAL
  Filled 2019-11-21 (×7): qty 1

## 2019-11-21 MED ORDER — PRENATAL MULTIVITAMIN CH
1.0000 | ORAL_TABLET | Freq: Every day | ORAL | Status: DC
  Administered 2019-11-21 – 2019-11-22 (×2): 1 via ORAL
  Filled 2019-11-21 (×2): qty 1

## 2019-11-21 MED ORDER — ACETAMINOPHEN 500 MG PO TABS: 1000.0000 mg | ORAL_TABLET | Freq: Four times a day (QID) | ORAL | Status: DC | PRN

## 2019-11-21 MED ORDER — DIPHENHYDRAMINE HCL 25 MG PO CAPS: 25.0000 mg | ORAL_CAPSULE | Freq: Four times a day (QID) | ORAL | Status: DC | PRN

## 2019-11-21 MED ORDER — ONDANSETRON HCL 4 MG/2ML IJ SOLN: 4.0000 mg | INTRAMUSCULAR | Status: DC | PRN

## 2019-11-21 MED ORDER — BENZOCAINE-MENTHOL 20-0.5 % EX AERO
1.0000 "application " | INHALATION_SPRAY | CUTANEOUS | Status: DC | PRN
  Administered 2019-11-21: 1 via TOPICAL
  Filled 2019-11-21: qty 56

## 2019-11-21 MED ORDER — DIBUCAINE (PERIANAL) 1 % EX OINT: 1.0000 "application " | TOPICAL_OINTMENT | CUTANEOUS | Status: DC | PRN

## 2019-11-21 MED ORDER — COCONUT OIL OIL: 1.0000 "application " | TOPICAL_OIL | Status: DC | PRN

## 2019-11-21 MED ORDER — DOCUSATE SODIUM 100 MG PO CAPS
100.0000 mg | ORAL_CAPSULE | Freq: Two times a day (BID) | ORAL | Status: DC
  Administered 2019-11-21 – 2019-11-22 (×3): 100 mg via ORAL
  Filled 2019-11-21 (×3): qty 1

## 2019-11-21 MED ORDER — SIMETHICONE 80 MG PO CHEW: 80.0000 mg | CHEWABLE_TABLET | ORAL | Status: DC | PRN

## 2019-11-21 MED ORDER — ONDANSETRON HCL 4 MG PO TABS: 4.0000 mg | ORAL_TABLET | ORAL | Status: DC | PRN

## 2019-11-21 MED ORDER — WITCH HAZEL-GLYCERIN EX PADS: 1.0000 "application " | MEDICATED_PAD | CUTANEOUS | Status: DC

## 2019-11-21 NOTE — Progress Notes (Signed)
Post Partum Day 1  Subjective: Doing well, no concerns. Ambulating without difficulty, pain managed with PO meds, tolerating regular diet, and voiding without difficulty.   No fever/chills, chest pain, shortness of breath, nausea/vomiting, or leg pain. No nipple or breast pain.   Objective: BP 111/68 (BP Location: Right Arm)   Pulse 60   Temp 97.7 F (36.5 C) (Oral)   Resp 20   Ht 5\' 9"  (1.753 m)   Wt 94.3 kg   LMP 01/27/2019   SpO2 100%   Breastfeeding Unknown   BMI 30.72 kg/m    Physical Exam:  General: alert and cooperative Breasts: soft/nontender CV: RRR Pulm: nl effort Abdomen: soft, non-tender Uterine Fundus: firm Incision: n/a Perineum: minimal edema Lochia: appropriate DVT Evaluation: No evidence of DVT seen on physical exam. Edinburgh:  Edinburgh Postnatal Depression Scale Screening Tool 11/21/2019  I have been able to laugh and see the funny side of things. 0  I have looked forward with enjoyment to things. 0  I have blamed myself unnecessarily when things went wrong. 0  I have been anxious or worried for no good reason. 0  I have felt scared or panicky for no good reason. 0  Things have been getting on top of me. 1  I have been so unhappy that I have had difficulty sleeping. 0  I have felt sad or miserable. 0  I have been so unhappy that I have been crying. 0  The thought of harming myself has occurred to me. 0  Edinburgh Postnatal Depression Scale Total 1     Recent Labs    11/20/19 0732 11/21/19 0556  HGB 11.5* 10.6*  HCT 32.7* 29.9*  WBC 8.5 12.3*  PLT 172 165    Assessment/Plan: 29 y.o. 11/23/19 postpartum day # 1  -Continue routine postpartum care -Lactation consult PRN for breastfeeding   -Acute blood loss anemia - hemodynamically stable and asymptomatic; start PO ferrous sulfate BID with stool softeners  -Immunization status: all immunizations up to date  Disposition: Continue inpatient postpartum care    LOS: 1 day   Kalliopi Coupland, CNM 11/21/2019, 7:57 PM

## 2019-11-21 NOTE — Lactation Note (Signed)
This note was copied from a baby's chart. Lactation Consultation Note  Patient Name: Madeline Hoffman [id number] Date: 11/21/2019 Reason for consult: Initial assessment;Term   Maternal Data Formula Feeding for Exclusion: No Does the patient have breastfeeding experience prior to this delivery?: Yes Mom breastfed her 2 youngest children x 1 yr, reports no problems Feeding Feeding Type:  (did not observe a feeding, had recently fed) Mom states baby latching well but gaggy and spitting mucous and brown fluid, short feeds, heard swallows LATCH Score                   Interventions Interventions: Breast feeding basics reviewed LC name and no written on white board, [place] and breastfeeding resources handouts given Lactation Tools Discussed/Used WIC Program: No   Consult Status Consult Status: PRN    Dyann Kief 11/21/2019, 4:34 PM

## 2019-11-22 DIAGNOSIS — D62 Acute posthemorrhagic anemia: Secondary | ICD-10-CM | POA: Diagnosis not present

## 2019-11-22 NOTE — Discharge Instructions (Signed)

## 2019-11-22 NOTE — Progress Notes (Signed)
Discharge order received from doctor. Reviewed discharge instructions and medications with patient and answered all questions. Follow up appointment instructions given. Patient verbalized understanding. ID bands checked. Patient discharged home with infant via wheelchair by nursing/auxillary.    Grayson White, RN  

## 2020-04-09 ENCOUNTER — Ambulatory Visit: Payer: Self-pay

## 2020-04-09 NOTE — Lactation Note (Signed)
This note was copied from a baby's chart. Lactation Consultation Note  Patient Name: Madeline Hoffman [id number] Date: 04/09/2020   Age:32 m.o.  Maternal Data  Mom breastfed her last two children for 1 yr, started supplementing with solids around 7 mths with her last child, they had slow wt gain, she has a [employer] breast pump.     Feeding  Baby Madeline Hoffman is alert and active,  appears very healthy.  Wt before feed, in diaper only, was 6165 gm or 13.9.6 lbs, wt after feed was 6270 gms with an intake of 105 ml or 3.5 oz.  Lots of swallows heard at breast, more on right breast, baby active at breast, pulls off frequently and looks around after approx. 5 min.  No problems with latching, last feeding was 2 hrs ago of  3 oz EBM. Usually breastfeeds approx. 8x day. Lots of wets 6-8 plus and has poops at least every other day that are yellow and pasty in color.    LATCH Score                    Lactation Tools Discussed/Used  Mom given list of herbal galactogogues that she may be interested in trying, also foods that can boost production, given info about Reglan.      Interventions  Recommend supplementing 1-1.5  Oz EBM or formula after each feed for now, may try to pump after every breastfeeding, 3-5 min post decreased drops x 48 hrs to increase supply, may try to feed at breast more frequently.  Whatever works best for mom as she has 3 other children, the main goal is to increase baby's intake to at least 37.6 oz of breast milk or combo breastmilk and formula per day.    Discharge    Consult Status  May return prn for wt checks in between ped visits.  Next ped visit is in 2 wks.  Mom will probably return her for wt check next Friday, April 16, 2020.    Dyann Kief 04/09/2020, 2:08 PM

## 2020-04-16 ENCOUNTER — Ambulatory Visit: Payer: Self-pay

## 2020-04-16 NOTE — Lactation Note (Signed)
This note was copied from a baby's chart. Lactation Consultation Note  Patient Name: Cresta Riden RUEAV'W Date: 04/16/2020   Age:32 m.o.  Maternal Data    Feeding  Baby has gained up to 14 lb since last Friday, Wt before feed is 6350, wt after is 6440 after breastfeeding on one breast, showing an intake of 3 oz, mom continues to supplement an additional 1-2 oz of EBM after  Breastfeeding and also gives 1 tsp powdered formula in 3 oz bottle of EBM, 3x per day per Dr. Wilburn Mylar instruction.  Mom states that she feels her supply has increased with extra pumping after feedings.     LATCH Score                    Lactation Tools Discussed/Used  Continue above protocol until ped app next week.      Interventions    Discharge    Consult Status  prn    Dyann Kief 04/16/2020, 8:21 PM
